# Patient Record
Sex: Female | Born: 1965
Health system: Southern US, Academic
[De-identification: ages and names within clinical notes are randomized; demographics above are authoritative.]

## PROBLEM LIST (undated history)

## (undated) ENCOUNTER — Encounter

## (undated) ENCOUNTER — Telehealth: Attending: Dermatology | Primary: Dermatology

## (undated) ENCOUNTER — Encounter: Attending: Infectious Disease | Primary: Infectious Disease

## (undated) ENCOUNTER — Telehealth: Attending: Obstetrics & Gynecology | Primary: Obstetrics & Gynecology

## (undated) ENCOUNTER — Encounter: Attending: Hematology & Oncology | Primary: Hematology & Oncology

## (undated) ENCOUNTER — Encounter: Attending: Family | Primary: Family

## (undated) ENCOUNTER — Telehealth

## (undated) ENCOUNTER — Ambulatory Visit: Payer: PRIVATE HEALTH INSURANCE

## (undated) ENCOUNTER — Ambulatory Visit

## (undated) ENCOUNTER — Encounter: Attending: Ambulatory Care | Primary: Ambulatory Care

## (undated) ENCOUNTER — Ambulatory Visit: Attending: Pharmacist | Primary: Pharmacist

## (undated) ENCOUNTER — Encounter: Attending: Pharmacist | Primary: Pharmacist

## (undated) ENCOUNTER — Telehealth: Attending: Ambulatory Care | Primary: Ambulatory Care

## (undated) ENCOUNTER — Encounter: Attending: Obstetrics & Gynecology | Primary: Obstetrics & Gynecology

## (undated) ENCOUNTER — Ambulatory Visit: Payer: PRIVATE HEALTH INSURANCE | Attending: Radiation Oncology | Primary: Radiation Oncology

## (undated) ENCOUNTER — Ambulatory Visit: Payer: PRIVATE HEALTH INSURANCE | Attending: Hematology & Oncology | Primary: Hematology & Oncology

## (undated) ENCOUNTER — Encounter: Attending: Clinical | Primary: Clinical

## (undated) ENCOUNTER — Ambulatory Visit: Payer: PRIVATE HEALTH INSURANCE | Attending: Dermatology | Primary: Dermatology

## (undated) ENCOUNTER — Telehealth: Attending: Hematology & Oncology | Primary: Hematology & Oncology

## (undated) ENCOUNTER — Ambulatory Visit
Attending: Student in an Organized Health Care Education/Training Program | Primary: Student in an Organized Health Care Education/Training Program

## (undated) ENCOUNTER — Ambulatory Visit: Payer: PRIVATE HEALTH INSURANCE | Attending: Nutritionist | Primary: Nutritionist

## (undated) ENCOUNTER — Telehealth: Attending: Family | Primary: Family

## (undated) ENCOUNTER — Ambulatory Visit: Payer: PRIVATE HEALTH INSURANCE | Attending: Family Medicine | Primary: Family Medicine

## (undated) DIAGNOSIS — D259 Leiomyoma of uterus, unspecified: Secondary | ICD-10-CM

## (undated) DIAGNOSIS — N879 Dysplasia of cervix uteri, unspecified: Secondary | ICD-10-CM

## (undated) DIAGNOSIS — E669 Obesity, unspecified: Secondary | ICD-10-CM

## (undated) DIAGNOSIS — M25569 Pain in unspecified knee: Secondary | ICD-10-CM

## (undated) DIAGNOSIS — B977 Papillomavirus as the cause of diseases classified elsewhere: Secondary | ICD-10-CM

## (undated) DIAGNOSIS — G43909 Migraine, unspecified, not intractable, without status migrainosus: Secondary | ICD-10-CM

## (undated) DIAGNOSIS — D509 Iron deficiency anemia, unspecified: Secondary | ICD-10-CM

## (undated) DIAGNOSIS — H209 Unspecified iridocyclitis: Secondary | ICD-10-CM

## (undated) DIAGNOSIS — C50919 Malignant neoplasm of unspecified site of unspecified female breast: Secondary | ICD-10-CM

## (undated) DIAGNOSIS — G43829 Menstrual migraine, not intractable, without status migrainosus: Secondary | ICD-10-CM

## (undated) HISTORY — DX: Menstrual migraine, not intractable, without status migrainosus: G43.829

## (undated) HISTORY — DX: Malignant neoplasm of unspecified site of unspecified female breast: C50.919

## (undated) HISTORY — DX: Migraine, unspecified, not intractable, without status migrainosus: G43.909

## (undated) HISTORY — PX: HALLUX VALGUS CORRECTION: SUR315

## (undated) HISTORY — DX: Unspecified iridocyclitis: H20.9

---
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
## 2007-06-08 ENCOUNTER — Ambulatory Visit: Payer: Self-pay | Admitting: Internal Medicine

## 2007-06-23 ENCOUNTER — Emergency Department: Payer: Self-pay

## 2007-06-23 ENCOUNTER — Other Ambulatory Visit: Payer: Self-pay

## 2007-07-22 ENCOUNTER — Ambulatory Visit: Payer: Self-pay | Admitting: Emergency Medicine

## 2007-08-06 ENCOUNTER — Ambulatory Visit: Payer: Self-pay | Admitting: *Deleted

## 2007-09-16 ENCOUNTER — Ambulatory Visit: Payer: Self-pay | Admitting: Internal Medicine

## 2010-08-28 ENCOUNTER — Emergency Department: Payer: Self-pay | Admitting: Emergency Medicine

## 2011-12-10 HISTORY — PX: FOOT SURGERY: SHX648

## 2013-02-07 ENCOUNTER — Ambulatory Visit: Payer: Self-pay | Admitting: Family Medicine

## 2013-02-24 ENCOUNTER — Emergency Department: Payer: Self-pay

## 2013-02-24 LAB — COMPREHENSIVE METABOLIC PANEL
Albumin: 3.9 g/dL (ref 3.4–5.0)
Alkaline Phosphatase: 58 U/L (ref 50–136)
Anion Gap: 7 (ref 7–16)
BUN: 14 mg/dL (ref 7–18)
Bilirubin,Total: 0.3 mg/dL (ref 0.2–1.0)
Chloride: 106 mmol/L (ref 98–107)
Co2: 25 mmol/L (ref 21–32)
Creatinine: 0.96 mg/dL (ref 0.60–1.30)
EGFR (African American): >60
EGFR (Non-African Amer.): >60
Glucose: 83 mg/dL (ref 65–99)
Osmolality: 275 (ref 275–301)
SGOT(AST): 17 U/L (ref 15–37)
Sodium: 138 mmol/L (ref 136–145)

## 2013-02-24 LAB — CBC
HCT: 29.5 % — ABNORMAL LOW (ref 35.0–47.0)
HGB: 9.3 g/dL — ABNORMAL LOW (ref 12.0–16.0)
MCH: 20.7 pg — ABNORMAL LOW (ref 26.0–34.0)
MCHC: 31.6 g/dL — ABNORMAL LOW (ref 32.0–36.0)
MCV: 66 fL — ABNORMAL LOW (ref 80–100)
RBC: 4.51 10*6/uL (ref 3.80–5.20)
RDW: 17.2 % — ABNORMAL HIGH (ref 11.5–14.5)

## 2013-02-24 LAB — CK TOTAL AND CKMB (NOT AT ARMC): CK, Total: 73 U/L (ref 21–215)

## 2013-03-26 DIAGNOSIS — D509 Iron deficiency anemia, unspecified: Secondary | ICD-10-CM | POA: Insufficient documentation

## 2013-04-25 ENCOUNTER — Ambulatory Visit: Payer: Self-pay | Admitting: Family Medicine

## 2013-04-25 LAB — RAPID STREP-A WITH REFLX: Micro Text Report: NEGATIVE

## 2013-04-29 LAB — BETA STREP CULTURE(ARMC)

## 2014-07-31 ENCOUNTER — Ambulatory Visit: Payer: Self-pay | Admitting: Physician Assistant

## 2015-01-05 DIAGNOSIS — E669 Obesity, unspecified: Secondary | ICD-10-CM | POA: Insufficient documentation

## 2016-04-20 ENCOUNTER — Ambulatory Visit
Admission: EM | Admit: 2016-04-20 | Discharge: 2016-04-20 | Disposition: A | Discharge status: Home / Self Care | Payer: Managed Care, Other (non HMO) | Attending: Family Medicine | Admitting: Family Medicine

## 2016-04-20 ENCOUNTER — Encounter: Payer: Self-pay | Admitting: Gynecology

## 2016-04-20 DIAGNOSIS — B3749 Other urogenital candidiasis: Secondary | ICD-10-CM

## 2016-04-20 DIAGNOSIS — M545 Low back pain, unspecified: Secondary | ICD-10-CM

## 2016-04-20 HISTORY — DX: Leiomyoma of uterus, unspecified: D25.9

## 2016-04-20 HISTORY — DX: Dysplasia of cervix uteri, unspecified: N87.9

## 2016-04-20 HISTORY — DX: Obesity, unspecified: E66.9

## 2016-04-20 HISTORY — DX: Papillomavirus as the cause of diseases classified elsewhere: B97.7

## 2016-04-20 HISTORY — DX: Iron deficiency anemia, unspecified: D50.9

## 2016-04-20 HISTORY — DX: Pain in unspecified knee: M25.569

## 2016-04-20 LAB — URINALYSIS COMPLETE WITH MICROSCOPIC (ARMC ONLY)
Bacteria, UA: NONE SEEN
Bilirubin Urine: NEGATIVE
Glucose, UA: NEGATIVE mg/dL
Hgb urine dipstick: NEGATIVE
KETONES UR: NEGATIVE mg/dL
Leukocytes, UA: NEGATIVE
Nitrite: NEGATIVE
PROTEIN: NEGATIVE mg/dL
Specific Gravity, Urine: 1.025 (ref 1.005–1.030)
pH: 5 (ref 5.0–8.0)

## 2016-04-20 MED ORDER — ORPHENADRINE CITRATE ER 100 MG PO TB12: 100.0000 mg | ORAL_TABLET | Freq: Two times a day (BID) | ORAL | Status: DC

## 2016-04-20 MED ORDER — FLUCONAZOLE 150 MG PO TABS: 150.0000 mg | ORAL_TABLET | Freq: Once | ORAL | Status: DC

## 2016-04-20 MED ORDER — MELOXICAM 15 MG PO TABS: 15.0000 mg | ORAL_TABLET | Freq: Every day | ORAL | Status: DC

## 2016-04-20 NOTE — ED Provider Notes (Signed)
CSN: [id number]     Arrival date & time 04/20/16  0813 History   First MD Initiated Contact with Patient 04/20/16 [phone]    Nurses notes were reviewed. Chief Complaint  Patient presents with  . Urinary Tract Infection  Patient reports having increased back pain. Was seen by her back Dr. on Tuesday of this week back was adjusted. She continued to have back pain and she went back to see her back doctor on Friday for more adjustment. She states the back pains on both sides and seems to move towards the front. She was worried that she may have had a UTI she's never had a UTI since she came in to be seen since she woke this morning still back discomfort. She did state that the adjustment Friday seemed to help from an hour to within the back started bothering her again     Denies having your vaginal discharge she's not had sexual relations for over a year. She has history of iron deficiency anemia fibroid tumor HPV and dysplasia of the cervix. She's never smoked no pertinent family medical history. No known drug allergies.   (Consider location/radiation/quality/duration/timing/severity/associated sxs/prior Treatment) Patient is a 50 y.o. female presenting with urinary tract infection. The history is provided by the patient. No language interpreter was used.  Urinary Tract Infection Pain quality:  Aching Duration:  5 days Timing:  Constant Progression:  Worsening Chronicity:  New Relieved by:  Nothing Ineffective treatments: Spinal manipulation. Urinary symptoms: no discolored urine, no foul-smelling urine, no frequent urination, no hematuria, no hesitancy and no bladder incontinence   Associated symptoms: abdominal pain   Associated symptoms: no fever, no genital lesions and no vaginal discharge   Risk factors: no hx of pyelonephritis, no kidney transplant, no recurrent urinary tract infections, no renal disease, not sexually active, not single kidney, no sexually transmitted infections and no  urinary catheter     Past Medical History  Diagnosis Date  . Obesity   . Knee pain     Left  . Iron deficiency anemia   . Leiomyoma of uterus   . HPV (human papilloma virus) infection   . Dysplasia of cervix    Past Surgical History  Procedure Laterality Date  . Hallux valgus correction    . Foot surgery Bilateral    No family history on file. Social History  Substance Use Topics  . Smoking status: Never Smoker   . Smokeless tobacco: None  . Alcohol Use: No   OB History    No data available     Review of Systems  Constitutional: Negative for fever.  Gastrointestinal: Positive for abdominal pain.  Genitourinary: Negative for vaginal discharge.  All other systems reviewed and are negative.   Allergies  Review of patient's allergies indicates no known allergies.  Home Medications   Prior to Admission medications   Medication Sig Start Date End Date Taking? Authorizing Provider  traMADol (ULTRAM) 50 MG tablet Take by mouth every 6 (six) hours as needed.   Yes Historical Provider, MD  typhoid (VIVOTIF) DR capsule Take 1 capsule by mouth every other day.   Yes Historical Provider, MD  fluconazole (DIFLUCAN) 150 MG tablet Take 1 tablet (150 mg total) by mouth once. 04/20/16   Frederich Cha, MD  meloxicam (MOBIC) 15 MG tablet Take 1 tablet (15 mg total) by mouth daily. 04/20/16   Frederich Cha, MD  orphenadrine (NORFLEX) 100 MG tablet Take 1 tablet (100 mg total) by mouth 2 (two) times daily. 04/20/16  Frederich Cha, MD   Meds Ordered and Administered this Visit  Medications - No data to display  BP 117/77 mmHg  Pulse 60  Temp(Src) 98 F (36.7 C) (Oral)  Resp 18  Ht 5\' 5"  (1.651 m)  Wt 243 lb (110.224 kg)  BMI 40.44 kg/m2  SpO2 100%  LMP 03/30/2016 No data found.   Physical Exam  Constitutional: She is oriented to person, place, and time. She appears well-developed and well-nourished.  HENT:  Head: Normocephalic and atraumatic.  Eyes: Pupils are equal, round, and  reactive to light.  Neck: Neck supple.  Abdominal: Soft. Bowel sounds are normal. She exhibits no distension. There is no tenderness.  Musculoskeletal: She exhibits tenderness.       Lumbar back: She exhibits spasm.       Back:  Patient with muscle spasm bilaterally over the lower back.  Neurological: She is alert and oriented to person, place, and time.  Skin: Skin is warm.  Psychiatric: She has a normal mood and affect.  Vitals reviewed.   ED Course  Procedures (including critical care time)  Labs Review Labs Reviewed  URINALYSIS COMPLETEWITH MICROSCOPIC ([place]) - Abnormal; Notable for the following:    Squamous Epithelial / LPF 6-30 (*)    All other components within normal limits  URINE CULTURE    Imaging Review No results found.   Visual Acuity Review  Right Eye Distance:   Left Eye Distance:   Bilateral Distance:    Right Eye Near:   Left Eye Near:    Bilateral Near:     Results for orders placed or performed during the hospital encounter of 04/20/16  Urinalysis complete, with microscopic  Result Value Ref Range   Color, Urine YELLOW YELLOW   APPearance CLEAR CLEAR   Glucose, UA NEGATIVE NEGATIVE mg/dL   Bilirubin Urine NEGATIVE NEGATIVE   Ketones, ur NEGATIVE NEGATIVE mg/dL   Specific Gravity, Urine 1.025 1.005 - 1.030   Hgb urine dipstick NEGATIVE NEGATIVE   pH 5.0 5.0 - 8.0   Protein, ur NEGATIVE NEGATIVE mg/dL   Nitrite NEGATIVE NEGATIVE   Leukocytes, UA NEGATIVE NEGATIVE   RBC / HPF 0-5 0 - 5 RBC/hpf   WBC, UA 0-5 0 - 5 WBC/hpf   Bacteria, UA NONE SEEN NONE SEEN   Squamous Epithelial / LPF 6-30 (A) NONE SEEN   Budding Yeast PRESENT       MDM   1. Bilateral low back pain without sciatica   2. Yeast UTI    Really think the patient's problem is muscle spasms. Will place patient on Norflex he was placed on tramadol earlier add Mobic as well. For the yeast will place on Diflucan. Follow-up PCP if not better in a week.  We'll treat with  antibiotic urine culture comes back positive for bacterial organism.   Note: This dictation was prepared with Dragon dictation along with smaller phrase technology. Any transcriptional errors that result from this process are unintentional.    Frederich Cha, MD 04/20/16 [phone]

## 2016-04-20 NOTE — Discharge Instructions (Signed)
Back Pain, Adult Back pain is very common. The pain often gets better over time. The cause of back pain is usually not dangerous. Most people can learn to manage their back pain on their own.  HOME CARE  Watch your back pain for any changes. The following actions may help to lessen any pain you are feeling:  Stay active. Start with short walks on flat ground if you can. Try to walk farther each day.  Exercise regularly as told by your doctor. Exercise helps your back heal faster. It also helps avoid future injury by keeping your muscles strong and flexible.  Do not sit, drive, or stand in one place for more than 30 minutes.  Do not stay in bed. Resting more than 1-2 days can slow down your recovery.  Be careful when you bend or lift an object. Use good form when lifting:  Bend at your knees.  Keep the object close to your body.  Do not twist.  Sleep on a firm mattress. Lie on your side, and bend your knees. If you lie on your back, put a pillow under your knees.  Take medicines only as told by your doctor.  Put ice on the injured area.  Put ice in a plastic bag.  Place a towel between your skin and the bag.  Leave the ice on for 20 minutes, 2-3 times a day for the first 2-3 days. After that, you can switch between ice and heat packs.  Avoid feeling anxious or stressed. Find good ways to deal with stress, such as exercise.  Maintain a healthy weight. Extra weight puts stress on your back. GET HELP IF:   You have pain that does not go away with rest or medicine.  You have worsening pain that goes down into your legs or buttocks.  You have pain that does not get better in one week.  You have pain at night.  You lose weight.  You have a fever or chills. GET HELP RIGHT AWAY IF:   You cannot control when you poop (bowel movement) or pee (urinate).  Your arms or legs feel weak.  Your arms or legs lose feeling (numbness).  You feel sick to your stomach (nauseous) or  throw up (vomit).  You have belly (abdominal) pain.  You feel like you may pass out (faint).   This information is not intended to replace advice given to you by your health care provider. Make sure you discuss any questions you have with your health care provider.   Document Released: 05/13/2008 Document Revised: 12/16/2014 Document Reviewed: 03/29/2014 Elsevier Interactive Patient Education 2016 Elsevier Inc[employer]  Musculoskeletal Pain Musculoskeletal pain is muscle and boney aches and pains. These pains can occur in any part of the body. Your caregiver may treat you without knowing the cause of the pain. They may treat you if blood or urine tests, X-rays, and other tests were normal.  CAUSES There is often not a definite cause or reason for these pains. These pains may be caused by a type of germ (virus). The discomfort may also come from overuse. Overuse includes working out too hard when your body is not fit. Boney aches also come from weather changes. Bone is sensitive to atmospheric pressure changes. HOME CARE INSTRUCTIONS   Ask when your test results will be ready. Make sure you get your test results.  Only take over-the-counter or prescription medicines for pain, discomfort, or fever as directed by your caregiver. If you were given medications  for your condition, do not drive, operate machinery or power tools, or sign legal documents for 24 hours. Do not drink alcohol. Do not take sleeping pills or other medications that may interfere with treatment.  Continue all activities unless the activities cause more pain. When the pain lessens, slowly resume normal activities. Gradually increase the intensity and duration of the activities or exercise.  During periods of severe pain, bed rest may be helpful. Lay or sit in any position that is comfortable.  Putting ice on the injured area.  Put ice in a bag.  Place a towel between your skin and the bag.  Leave the ice on for 15 to 20  minutes, 3 to 4 times a day.  Follow up with your caregiver for continued problems and no reason can be found for the pain. If the pain becomes worse or does not go away, it may be necessary to repeat tests or do additional testing. Your caregiver may need to look further for a possible cause. SEEK IMMEDIATE MEDICAL CARE IF:  You have pain that is getting worse and is not relieved by medications.  You develop chest pain that is associated with shortness or breath, sweating, feeling sick to your stomach (nauseous), or throw up (vomit).  Your pain becomes localized to the abdomen.  You develop any new symptoms that seem different or that concern you. MAKE SURE YOU:   Understand these instructions.  Will watch your condition.  Will get help right away if you are not doing well or get worse.   This information is not intended to replace advice given to you by your health care provider. Make sure you discuss any questions you have with your health care provider.   Document Released: 11/25/2005 Document Revised: 02/17/2012 Document Reviewed: 07/30/2013 Elsevier Interactive Patient Education 2016 Elsevier Inc. Monilial Vaginitis Vaginitis in a soreness, swelling and redness (inflammation) of the vagina and vulva. Monilial vaginitis is not a sexually transmitted infection. CAUSES  Yeast vaginitis is caused by yeast (candida) that is normally found in your vagina. With a yeast infection, the candida has overgrown in number to a point that upsets the chemical balance. SYMPTOMS  White, thick vaginal discharge. Swelling, itching, redness and irritation of the vagina and possibly the lips of the vagina (vulva). Burning or painful urination. Painful intercourse. DIAGNOSIS  Things that may contribute to monilial vaginitis are: Postmenopausal and virginal states. Pregnancy. Infections. Being tired, sick or stressed, especially if you had monilial vaginitis in the past. Diabetes. Good control  will help lower the chance. Birth control pills. Tight fitting garments. Using bubble bath, feminine sprays, douches or deodorant tampons. Taking certain medications that kill germs (antibiotics). Sporadic recurrence can occur if you become ill. TREATMENT  Your caregiver will give you medication. There are several kinds of anti monilial vaginal creams and suppositories specific for monilial vaginitis. For recurrent yeast infections, use a suppository or cream in the vagina 2 times a week, or as directed. Anti-monilial or steroid cream for the itching or irritation of the vulva may also be used. Get your caregiver's permission. Painting the vagina with methylene blue solution may help if the monilial cream does not work. Eating yogurt may help prevent monilial vaginitis. HOME CARE INSTRUCTIONS  Finish all medication as prescribed. Do not have sex until treatment is completed or after your caregiver tells you it is okay. Take warm sitz baths. Do not douche. Do not use tampons, especially scented ones. Wear cotton underwear. Avoid tight pants and  panty hose. Tell your sexual partner that you have a yeast infection. They should go to their caregiver if they have symptoms such as mild rash or itching. Your sexual partner should be treated as well if your infection is difficult to eliminate. Practice safer sex. Use condoms. Some vaginal medications cause latex condoms to fail. Vaginal medications that harm condoms are: Cleocin cream. Butoconazole (Femstat). Terconazole (Terazol) vaginal suppository. Miconazole (Monistat) (may be purchased over the counter). SEEK MEDICAL CARE IF:  You have a temperature by mouth above 102 F (38.9 C). The infection is getting worse after 2 days of treatment. The infection is not getting better after 3 days of treatment. You develop blisters in or around your vagina. You develop vaginal bleeding, and it is not your menstrual period. You have pain when  you urinate. You develop intestinal problems. You have pain with sexual intercourse.   This information is not intended to replace advice given to you by your health care provider. Make sure you discuss any questions you have with your health care provider.   Document Released: 09/04/2005 Document Revised: 02/17/2012 Document Reviewed: 05/29/2015 Elsevier Interactive Patient Education [employer].

## 2016-04-20 NOTE — ED Notes (Signed)
Patient c/o lower back and pelvic pain x 3 days.

## 2016-04-22 LAB — URINE CULTURE

## 2016-10-31 ENCOUNTER — Encounter: Payer: Self-pay | Admitting: Emergency Medicine

## 2016-10-31 ENCOUNTER — Emergency Department
Admission: EM | Admit: 2016-10-31 | Discharge: 2016-10-31 | Disposition: A | Discharge status: Home / Self Care | Payer: Managed Care, Other (non HMO) | Attending: Emergency Medicine | Admitting: Emergency Medicine

## 2016-10-31 DIAGNOSIS — J029 Acute pharyngitis, unspecified: Secondary | ICD-10-CM | POA: Diagnosis present

## 2016-10-31 DIAGNOSIS — Z79899 Other long term (current) drug therapy: Secondary | ICD-10-CM | POA: Diagnosis not present

## 2016-10-31 LAB — POCT RAPID STREP A: STREPTOCOCCUS, GROUP A SCREEN (DIRECT): NEGATIVE

## 2016-10-31 MED ORDER — AMOXICILLIN-POT CLAVULANATE 875-125 MG PO TABS
1.0000 | ORAL_TABLET | Freq: Once | ORAL | Status: AC
  Administered 2016-10-31: 1 via ORAL

## 2016-10-31 MED ORDER — AMOXICILLIN-POT CLAVULANATE 200-28.5 MG PO CHEW: 1.0000 | CHEWABLE_TABLET | Freq: Two times a day (BID) | ORAL | 0 refills | Status: AC

## 2016-10-31 MED ORDER — AMOXICILLIN-POT CLAVULANATE 875-125 MG PO TABS
ORAL_TABLET | ORAL | Status: AC
  Administered 2016-10-31: 1 via ORAL
  Filled 2016-10-31: qty 1

## 2016-10-31 NOTE — ED Provider Notes (Signed)
[facility] Emergency Department Provider Note    First MD Initiated Contact with Patient 10/31/16 [phone]     (approximate)  I have reviewed the triage vital signs and the nursing notes.   HISTORY  Chief Complaint Sore Throat    HPI Kristina Graham is a 50 y.o. female with low list of chronic medical conditions presents to the emergency department with sore throat 3 days. Patient denies any fever afebrile on presentation temperature 90.7. Patient denies any other complaints. Patient admits to previous history of strep throat in the past. Current pain score 8 out of 10   Past Medical History:  Diagnosis Date  . Dysplasia of cervix   . HPV (human papilloma virus) infection   . Iron deficiency anemia   . Knee pain    Left  . Leiomyoma of uterus   . Obesity     There are no active problems to display for this patient.   Past Surgical History:  Procedure Laterality Date  . FOOT SURGERY Bilateral   . HALLUX VALGUS CORRECTION      Prior to Admission medications   Medication Sig Start Date End Date Taking? Authorizing Provider  fluconazole (DIFLUCAN) 150 MG tablet Take 1 tablet (150 mg total) by mouth once. 04/20/16   Frederich Cha, MD  meloxicam (MOBIC) 15 MG tablet Take 1 tablet (15 mg total) by mouth daily. 04/20/16   Frederich Cha, MD  orphenadrine (NORFLEX) 100 MG tablet Take 1 tablet (100 mg total) by mouth 2 (two) times daily. 04/20/16   Frederich Cha, MD  traMADol Veatrice Bourbon) 50 MG tablet Take by mouth every 6 (six) hours as needed.    Historical Provider, MD  typhoid (VIVOTIF) DR capsule Take 1 capsule by mouth every other day.    Historical Provider, MD    Allergies No known drug allergies History reviewed. No pertinent family history.  Social History Social History  Substance Use Topics  . Smoking status: Never Smoker  . Smokeless tobacco: Never Used  . Alcohol use No    Review of Systems Constitutional: No fever/chills Eyes: No visual  changes. [id number] for sore throat. Cardiovascular: Denies chest pain. Respiratory: Denies shortness of breath. Gastrointestinal: No abdominal pain.  No nausea, no vomiting.  No diarrhea.  No constipation. Genitourinary: Negative for dysuria. Musculoskeletal: Negative for back pain. Skin: Negative for rash. Neurological: Negative for headaches, focal weakness or numbness.  10-point ROS otherwise negative.  ____________________________________________   PHYSICAL EXAM:  VITAL SIGNS: ED Triage Vitals  Enc Vitals Group     BP 10/31/16 0536 133/86     Pulse Rate 10/31/16 0536 90     Resp 10/31/16 0536 18     Temp 10/31/16 0536 98.7 F (37.1 C)     Temp Source 10/31/16 0536 Oral     SpO2 10/31/16 0536 99 %     Weight 10/31/16 0527 240 lb (108.9 kg)     Height 10/31/16 0527 5\' 5"  (1.651 m)     Head Circumference --      Peak Flow --      Pain Score 10/31/16 0527 7     Pain Loc --      Pain Edu? --      Excl. in [place]? --     Constitutional: Alert and oriented. Well appearing and in no acute distress. Eyes: Conjunctivae are normal. PERRL. EOMI. Head: Atraumatic. Ears:  Healthy appearing ear canals and TMs bilaterally Nose: No congestion/rhinnorhea. Mouth/Throat: Mucous membranes are moist.  Oropharynx non-erythematous. Neck: No stridor.  No meningeal signs.  No cervical spine tenderness to palpation. Cardiovascular: Normal rate, regular rhythm. Good peripheral circulation. Grossly normal heart sounds. Respiratory: Normal respiratory effort.  No retractions. Lungs CTAB. Gastrointestinal: Soft and nontender. No distention.  Musculoskeletal: No lower extremity tenderness nor edema. No gross deformities of extremities. Neurologic:  Normal speech and language. No gross focal neurologic deficits are appreciated.  Skin:  Skin is warm, dry and intact. No rash noted. Psychiatric: Mood and affect are normal. Speech and behavior are  normal.  ____________________________________________   LABS (all labs ordered are listed, but only abnormal results are displayed)  Labs Reviewed  CULTURE, GROUP A STREP [facility])  POCT RAPID STREP A      Procedures     INITIAL IMPRESSION / ASSESSMENT AND PLAN / ED COURSE  Pertinent labs & imaging results that were available during my care of the patient were reviewed by me and considered in my medical decision making (see chart for details).     Clinical Course     ____________________________________________  FINAL CLINICAL IMPRESSION(S) / ED DIAGNOSES  Final diagnoses:  Acute pharyngitis, unspecified etiology     MEDICATIONS GIVEN DURING THIS VISIT:  Medications  amoxicillin-clavulanate (AUGMENTIN) 875-125 MG per tablet 1 tablet (not administered)     NEW OUTPATIENT MEDICATIONS STARTED DURING THIS VISIT:  New Prescriptions   No medications on file    Modified Medications   No medications on file    Discontinued Medications   No medications on file     Note:  This document was prepared using Dragon voice recognition software and may include unintentional dictation errors.    Gregor Hams, MD 10/31/16 [phone]

## 2016-10-31 NOTE — ED Notes (Signed)
Discharge instructions reviewed with patient. Questions fielded by this RN. Patient verbalizes understanding of instructions. Patient discharged home in stable condition per Brown MD . No acute distress noted at time of discharge.   

## 2016-10-31 NOTE — ED Triage Notes (Signed)
Pt arrived to the ED for complaints of sore throat. Pt is AOx4 in no apparent distress.

## 2016-11-02 LAB — CULTURE, GROUP A STREP (THRC)

## 2017-01-28 ENCOUNTER — Ambulatory Visit: Payer: Self-pay | Admitting: Physician Assistant

## 2017-01-28 ENCOUNTER — Encounter: Payer: Self-pay | Admitting: Physician Assistant

## 2017-01-28 ENCOUNTER — Ambulatory Visit
Admission: RE | Admit: 2017-01-28 | Discharge: 2017-01-28 | Disposition: A | Discharge status: Home / Self Care | Payer: Managed Care, Other (non HMO) | Source: Ambulatory Visit | Attending: Physician Assistant | Admitting: Physician Assistant

## 2017-01-28 VITALS — BP 120/80 | HR 85 | Temp 98.4°F

## 2017-01-28 DIAGNOSIS — G459 Transient cerebral ischemic attack, unspecified: Secondary | ICD-10-CM | POA: Insufficient documentation

## 2017-01-28 NOTE — Progress Notes (Addendum)
S: states this weekend she got her hair washed then afterwards started having tingling sensations on her scalp and cold feeling on r side of her face, couldn't open her eye for several hours, was having to pry it open and finally it stayed open without difficulty, did have some slurred speech and blurred vision, states she is still having difficulty with words and feels like she is talking slowly, no rash on scalp, no hx htn, no fam hx of strokes Denies cp/sob  O: vitals wnl, nad, perrl eomi, able to open and close eyes without difficulty, lungs c t a, cv rrr, carotids without bruits, cn II-XII intact  A: tia?  P: ct head without contrast stat, if normal pt to start taking asa 81 mg qd  Discussed CT results with patient, will order [place] of carotids and refer to neuro

## 2017-01-29 NOTE — Addendum Note (Signed)
Addended by: Versie Starks on: 01/29/2017 02:40 PM   Modules accepted: Orders

## 2017-01-31 ENCOUNTER — Other Ambulatory Visit: Payer: Self-pay

## 2017-01-31 ENCOUNTER — Encounter (INDEPENDENT_AMBULATORY_CARE_PROVIDER_SITE_OTHER): Payer: Self-pay

## 2017-01-31 DIAGNOSIS — Z299 Encounter for prophylactic measures, unspecified: Secondary | ICD-10-CM

## 2017-01-31 NOTE — Progress Notes (Signed)
Patient came in to have blood drawn for testing per Manuela Schwartz Fisher's orders.

## 2017-02-01 LAB — CMP12+LP+TP+TSH+6AC+CBC/D/PLT
A/G RATIO: 1.3 (ref 1.2–2.2)
ALBUMIN: 4.2 g/dL (ref 3.5–5.5)
ALK PHOS: 52 IU/L (ref 39–117)
ALT: 8 IU/L (ref 0–32)
AST: 10 IU/L (ref 0–40)
BUN/Creatinine Ratio: 14 (ref 9–23)
BUN: 11 mg/dL (ref 6–24)
Basophils Absolute: 0 10*3/uL (ref 0.0–0.2)
Basos: 0 %
Bilirubin Total: 0.3 mg/dL (ref 0.0–1.2)
CALCIUM: 9.1 mg/dL (ref 8.7–10.2)
CHOLESTEROL TOTAL: 158 mg/dL (ref 100–199)
Chloride: 104 mmol/L (ref 96–106)
Chol/HDL Ratio: 2.8 ratio units (ref 0.0–4.4)
Creatinine, Ser: 0.77 mg/dL (ref 0.57–1.00)
EOS (ABSOLUTE): 0 10*3/uL (ref 0.0–0.4)
Eos: 1 %
Estimated CHD Risk: <0.5 times avg. (ref 0.0–1.0)
Free Thyroxine Index: 1.6 (ref 1.2–4.9)
GFR calc Af Amer: 105 mL/min/{1.73_m2} (ref >59)
GFR calc non Af Amer: 91 mL/min/{1.73_m2} (ref >59)
GGT: 31 IU/L (ref 0–60)
GLOBULIN, TOTAL: 3.2 g/dL (ref 1.5–4.5)
Glucose: 99 mg/dL (ref 65–99)
HDL: 56 mg/dL (ref >39)
HEMATOCRIT: 28.9 % — AB (ref 34.0–46.6)
Hemoglobin: 8.4 g/dL — ABNORMAL LOW (ref 11.1–15.9)
IMMATURE GRANS (ABS): 0 10*3/uL (ref 0.0–0.1)
Immature Granulocytes: 0 %
Iron: 24 ug/dL — ABNORMAL LOW (ref 27–159)
LDH: 139 IU/L (ref 119–226)
LDL CALC: 90 mg/dL (ref 0–99)
LYMPHS: 32 %
Lymphocytes Absolute: 1.5 10*3/uL (ref 0.7–3.1)
MCH: 19.3 pg — ABNORMAL LOW (ref 26.6–33.0)
MCHC: 29.1 g/dL — AB (ref 31.5–35.7)
MCV: 66 fL — ABNORMAL LOW (ref 79–97)
MONOS ABS: 0.3 10*3/uL (ref 0.1–0.9)
Monocytes: 7 %
Neutrophils Absolute: 2.7 10*3/uL (ref 1.4–7.0)
Neutrophils: 60 %
Phosphorus: 2.6 mg/dL (ref 2.5–4.5)
Platelets: 387 10*3/uL — ABNORMAL HIGH (ref 150–379)
Potassium: 4.5 mmol/L (ref 3.5–5.2)
RBC: 4.36 x10E6/uL (ref 3.77–5.28)
RDW: 18.4 % — ABNORMAL HIGH (ref 12.3–15.4)
Sodium: 141 mmol/L (ref 134–144)
T3 Uptake Ratio: 22 % — ABNORMAL LOW (ref 24–39)
T4 TOTAL: 7.4 ug/dL (ref 4.5–12.0)
TRIGLYCERIDES: 59 mg/dL (ref 0–149)
TSH: 1.72 u[IU]/mL (ref 0.450–4.500)
Total Protein: 7.4 g/dL (ref 6.0–8.5)
Uric Acid: 5 mg/dL (ref 2.5–7.1)
VLDL Cholesterol Cal: 12 mg/dL (ref 5–40)
WBC: 4.6 10*3/uL (ref 3.4–10.8)

## 2017-02-01 LAB — HEPATITIS C ANTIBODY (REFLEX): HCV Ab: <0.1 s/co ratio (ref 0.0–0.9)

## 2017-02-01 LAB — VITAMIN B12: VITAMIN B 12: 356 pg/mL (ref 232–1245)

## 2017-02-01 LAB — HCV COMMENT:

## 2017-02-01 LAB — HGB A1C W/O EAG: HEMOGLOBIN A1C: 5.5 % (ref 4.8–5.6)

## 2017-02-01 LAB — HIV ANTIBODY (ROUTINE TESTING W REFLEX): HIV Screen 4th Generation wRfx: NONREACTIVE

## 2017-02-04 ENCOUNTER — Ambulatory Visit
Admission: RE | Admit: 2017-02-04 | Discharge: 2017-02-04 | Disposition: A | Discharge status: Home / Self Care | Payer: Managed Care, Other (non HMO) | Source: Ambulatory Visit | Attending: Physician Assistant | Admitting: Physician Assistant

## 2017-02-04 DIAGNOSIS — G459 Transient cerebral ischemic attack, unspecified: Secondary | ICD-10-CM | POA: Insufficient documentation

## 2017-02-07 ENCOUNTER — Ambulatory Visit: Payer: Managed Care, Other (non HMO)

## 2017-03-04 ENCOUNTER — Ambulatory Visit (INDEPENDENT_AMBULATORY_CARE_PROVIDER_SITE_OTHER): Payer: Managed Care, Other (non HMO) | Admitting: Neurology

## 2017-03-04 ENCOUNTER — Encounter: Payer: Self-pay | Admitting: Neurology

## 2017-03-04 ENCOUNTER — Encounter (INDEPENDENT_AMBULATORY_CARE_PROVIDER_SITE_OTHER): Payer: Self-pay

## 2017-03-04 VITALS — BP 130/81 | HR 73 | Ht 65.0 in | Wt 247.5 lb

## 2017-03-04 DIAGNOSIS — D500 Iron deficiency anemia secondary to blood loss (chronic): Secondary | ICD-10-CM

## 2017-03-04 DIAGNOSIS — G43829 Menstrual migraine, not intractable, without status migrainosus: Secondary | ICD-10-CM | POA: Diagnosis not present

## 2017-03-04 DIAGNOSIS — H02401 Unspecified ptosis of right eyelid: Secondary | ICD-10-CM | POA: Diagnosis not present

## 2017-03-04 HISTORY — DX: Menstrual migraine, not intractable, without status migrainosus: G43.829

## 2017-03-04 MED ORDER — FERROUS GLUCONATE 324 (38 FE) MG PO TABS: 324.0000 mg | ORAL_TABLET | Freq: Every day | ORAL | 3 refills | Status: AC

## 2017-03-04 NOTE — Progress Notes (Signed)
Reason for visit: Headache, right-sided ptosis  Referring physician: Dr. Lanice Schwab is a 51 y.o. female  History of present illness:  [name] is a 51 year old right-handed black female with a history of menstrual migraine. The patient will have 3 or 4 headaches a month, usually on the right side of the head. Most of her headaches are just prior to onset of her menstrual cycle, but she will occasionally get headaches at nighttime while sleeping. The patient had onset of a headache around 01/25/2017 that started at nighttime. She got up to go the bathroom, she noted that she was unable to open the left eye. The eyelid completely closed, and she cannot see out of the right eye. She had a severe headache on the right with sharp pain from the right frontotemporal area going down to behind the right ear. She would try to hold the eyelid open, but it would droop back down. After an hour so, the problem with the eye resolved. She denied any numbness of the extremities or on the face. She did have some blurring of vision. She denied any weakness of the extremities or change in balance. The patient claims that the sharp shooting pains are unusual for her typical migraine. The patient went to her primary doctor 2 days later, and a CT scan of the brain was done and was unremarkable. The patient has not had recurrence of the above events since that time. She has been noted to have an iron deficiency anemia by blood work. With her typical migraine, she has right-sided pain, she may have nausea and vomiting, she may have photophobia and phonophobia, and some cognitive slowing. She is sent to this office for further evaluation. In the past, she could not tolerate Imitrex or Maxalt as it caused her throat to swell.  Past Medical History:  Diagnosis Date  . Dysplasia of cervix   . HPV (human papilloma virus) infection   . Iron deficiency anemia   . Knee pain    Left  . Leiomyoma of uterus   .  Menstrual migraine 03/04/2017  . Migraine   . Obesity     Past Surgical History:  Procedure Laterality Date  . FOOT SURGERY Bilateral 2013  . HALLUX VALGUS CORRECTION      Family History  Problem Relation Age of Onset  . Diabetes Mother   . Heart disease Mother   . Hypertension Mother   . COPD Mother   . Cancer Mother   . Diabetes Sister   . Diabetes Maternal Grandmother   . Heart disease Maternal Grandmother   . Hypertension Maternal Grandmother   . Cancer Maternal Grandmother     Social history:  reports that she has never smoked. She has never used smokeless tobacco. She reports that she does not drink alcohol or use drugs.  Medications:  Prior to Admission medications   Medication Sig Start Date End Date Taking? Authorizing Provider  ferrous gluconate (FERGON) 324 MG tablet Take 1 tablet (324 mg total) by mouth daily with breakfast. 03/04/17   Kathrynn Ducking, MD      Allergies  Allergen Reactions  . Sumatriptan Succinate     Other reaction(s): SWELLING/EDEMA    ROS:  Out of a complete 14 system review of symptoms, the patient complains only of the following symptoms, and all other reviewed systems are negative.  Weight gain, fatigue Blurred vision Snoring Muscle cramps  Blood pressure 130/81, pulse 73, height 5\' 5"  (1.651  m), weight 247 lb 8 oz (112.3 kg).  Physical Exam  General: The patient is alert and cooperative at the time of the examination. The patient is moderately to markedly obese.  Eyes: Pupils are equal, round, and reactive to light. Discs are flat bilaterally.  Neck: The neck is supple, no carotid bruits are noted.  Respiratory: The respiratory examination is clear.  Cardiovascular: The cardiovascular examination reveals a regular rate and rhythm, no obvious murmurs or rubs are noted.  Skin: Extremities are without significant edema.  Neurologic Exam  Mental status: The patient is alert and oriented x 3 at the time of the  examination. The patient has apparent normal recent and remote memory, with an apparently normal attention span and concentration ability.  Cranial nerves: Facial symmetry is present. There is good sensation of the face to pinprick and soft touch bilaterally. The strength of the facial muscles and the muscles to head turning and shoulder shrug are normal bilaterally. Speech is well enunciated, no aphasia or dysarthria is noted. Extraocular movements are full. Visual fields are full. The tongue is midline, and the patient has symmetric elevation of the soft palate. No obvious hearing deficits are noted.  Motor: The motor testing reveals 5 over 5 strength of all 4 extremities. Good symmetric motor tone is noted throughout.  Sensory: Sensory testing is intact to pinprick, soft touch, vibration sensation, and position sense on all 4 extremities. No evidence of extinction is noted.  Coordination: Cerebellar testing reveals good finger-nose-finger and heel-to-shin bilaterally.  Gait and station: Gait is normal. Tandem gait is normal. Romberg is negative. No drift is seen.  Reflexes: Deep tendon reflexes are symmetric and normal bilaterally. Toes are downgoing bilaterally.   Assessment/Plan:  1. History of menstrual migraine  2. Transient episode of ptosis, right eye  3. Iron deficiency anemia  The episode of sharp shooting pains with right-sided ptosis is unusual for this patient's headache. Migraine may cause a Horner's syndrome, but this generally does not close down the eye completely. For this reason, the patient be set up for further evaluation with MRI of the brain and MRA of the head to exclude an aneurysm. The clinical examination today is completely normal. The patient will follow-up if needed if the above study is unremarkable. The patient was given iron supplementation for her anemia.  Jill Alexanders MD 03/04/2017 11:52 AM  [facility] [address] [place], [place] [address]  Phone [phone] Fax [phone]

## 2017-03-04 NOTE — Patient Instructions (Signed)
   We will get MRI of the brain and MRA of the head. Will start iron therapy for anemia.

## 2017-03-14 DIAGNOSIS — Z8673 Personal history of transient ischemic attack (TIA), and cerebral infarction without residual deficits: Secondary | ICD-10-CM | POA: Insufficient documentation

## 2017-03-14 DIAGNOSIS — C50311 Malignant neoplasm of lower-inner quadrant of right female breast: Secondary | ICD-10-CM | POA: Insufficient documentation

## 2017-03-14 DIAGNOSIS — R87610 Atypical squamous cells of undetermined significance on cytologic smear of cervix (ASC-US): Secondary | ICD-10-CM | POA: Insufficient documentation

## 2017-03-14 DIAGNOSIS — Z171 Estrogen receptor negative status [ER-]: Secondary | ICD-10-CM

## 2017-03-14 DIAGNOSIS — R7303 Prediabetes: Secondary | ICD-10-CM | POA: Insufficient documentation

## 2017-03-21 ENCOUNTER — Inpatient Hospital Stay: Admission: RE | Admit: 2017-03-21 | Payer: Self-pay | Source: Ambulatory Visit

## 2017-03-21 ENCOUNTER — Other Ambulatory Visit: Payer: Self-pay

## 2017-04-14 DIAGNOSIS — R110 Nausea: Secondary | ICD-10-CM | POA: Insufficient documentation

## 2017-06-12 ENCOUNTER — Ambulatory Visit
Admission: RE | Admit: 2017-06-12 | Discharge: 2017-06-12 | Disposition: A | Discharge status: Home / Self Care | Payer: PRIVATE HEALTH INSURANCE | Attending: Family | Admitting: Family

## 2017-06-12 ENCOUNTER — Ambulatory Visit
Admission: RE | Admit: 2017-06-12 | Discharge: 2017-06-12 | Disposition: A | Discharge status: Home / Self Care | Payer: PRIVATE HEALTH INSURANCE

## 2017-06-12 DIAGNOSIS — T80212A Local infection due to central venous catheter, initial encounter: Secondary | ICD-10-CM

## 2017-06-12 DIAGNOSIS — C50311 Malignant neoplasm of lower-inner quadrant of right female breast: Principal | ICD-10-CM

## 2017-06-12 DIAGNOSIS — Z171 Estrogen receptor negative status [ER-]: Secondary | ICD-10-CM

## 2017-06-12 DIAGNOSIS — L0390 Cellulitis, unspecified: Secondary | ICD-10-CM

## 2017-06-12 MED ORDER — CEPHALEXIN 500 MG CAPSULE
ORAL_CAPSULE | Freq: Two times a day (BID) | ORAL | 0 refills | 0 days | Status: CP
Start: 2017-06-12 — End: 2017-06-19

## 2017-06-12 MED ORDER — LORAZEPAM 1 MG TABLET
ORAL_TABLET | Freq: Four times a day (QID) | ORAL | 0 refills | 0 days | Status: CP | PRN
Start: 2017-06-12 — End: 2018-09-10

## 2017-06-17 ENCOUNTER — Ambulatory Visit
Admission: RE | Admit: 2017-06-17 | Discharge: 2017-06-17 | Disposition: A | Discharge status: Home / Self Care | Payer: PRIVATE HEALTH INSURANCE

## 2017-06-17 DIAGNOSIS — Z452 Encounter for adjustment and management of vascular access device: Principal | ICD-10-CM

## 2017-06-25 ENCOUNTER — Ambulatory Visit
Admission: RE | Admit: 2017-06-25 | Discharge: 2017-06-25 | Disposition: A | Discharge status: Home / Self Care | Payer: PRIVATE HEALTH INSURANCE

## 2017-06-25 DIAGNOSIS — T82594A Other mechanical complication of infusion catheter, initial encounter: Principal | ICD-10-CM

## 2017-06-27 ENCOUNTER — Ambulatory Visit: Admission: RE | Admit: 2017-06-27 | Discharge: 2017-06-27 | Payer: PRIVATE HEALTH INSURANCE

## 2017-06-27 MED ORDER — HEPARIN, PORCINE (PF) 100 UNIT/ML INTRAVENOUS SYRINGE
INJECTION | INTRAVENOUS | 0 refills | 0.00000 days | Status: CP
Start: 2017-06-27 — End: 2018-09-10

## 2017-06-27 MED ORDER — SODIUM CHLORIDE 0.9 % (FLUSH) INJECTION SYRINGE
INTRAVENOUS | 1 refills | 0 days | Status: CP
Start: 2017-06-27 — End: 2018-09-10

## 2017-06-27 MED ORDER — HEPARIN, PORCINE (PF) 100 UNIT/ML INTRAVENOUS SYRINGE: 2 mL | Syringe | 0 refills | 0 days | Status: AC

## 2017-06-27 MED FILL — SODIUM CHLORIDE FLUSH(10ML)/0.9%/SOLN: SODIUM CHLORIDE FLUSH(10ML)/0.9%/SOLN | 7 days supply | Qty: 30 | Fill #0

## 2017-06-27 MED FILL — HEPARIN LOCK FLUSH/100/ML/SOLN: HEPARIN LOCK FLUSH/100/ML/SOLN | 28 days supply | Qty: 12 | Fill #0

## 2017-07-02 ENCOUNTER — Ambulatory Visit
Admission: RE | Admit: 2017-07-02 | Discharge: 2017-07-02 | Disposition: A | Discharge status: Home / Self Care | Payer: PRIVATE HEALTH INSURANCE

## 2017-07-02 ENCOUNTER — Ambulatory Visit
Admission: RE | Admit: 2017-07-02 | Discharge: 2017-07-02 | Disposition: A | Discharge status: Home / Self Care | Payer: MEDICAID | Attending: Hematology & Oncology | Admitting: Hematology & Oncology

## 2017-07-02 DIAGNOSIS — Z171 Estrogen receptor negative status [ER-]: Secondary | ICD-10-CM

## 2017-07-02 DIAGNOSIS — C50311 Malignant neoplasm of lower-inner quadrant of right female breast: Principal | ICD-10-CM

## 2017-07-09 ENCOUNTER — Ambulatory Visit
Admission: RE | Admit: 2017-07-09 | Discharge: 2017-08-08 | Disposition: A | Discharge status: Home / Self Care | Payer: MEDICAID

## 2017-07-09 ENCOUNTER — Ambulatory Visit
Admission: RE | Admit: 2017-07-09 | Discharge: 2017-08-08 | Disposition: A | Discharge status: Home / Self Care | Payer: PRIVATE HEALTH INSURANCE | Attending: Radiation Oncology | Admitting: Radiation Oncology

## 2017-07-09 DIAGNOSIS — Z171 Estrogen receptor negative status [ER-]: Secondary | ICD-10-CM

## 2017-07-09 DIAGNOSIS — Z510 Encounter for antineoplastic radiation therapy: Principal | ICD-10-CM

## 2017-07-09 DIAGNOSIS — C50311 Malignant neoplasm of lower-inner quadrant of right female breast: Secondary | ICD-10-CM

## 2017-07-19 ENCOUNTER — Ambulatory Visit
Admission: EM | Admit: 2017-07-19 | Discharge: 2017-07-19 | Disposition: A | Discharge status: Home / Self Care | Payer: Managed Care, Other (non HMO) | Attending: Family | Admitting: Family

## 2017-07-19 DIAGNOSIS — Z79899 Other long term (current) drug therapy: Secondary | ICD-10-CM | POA: Diagnosis not present

## 2017-07-19 DIAGNOSIS — L02411 Cutaneous abscess of right axilla: Secondary | ICD-10-CM

## 2017-07-19 DIAGNOSIS — L02412 Cutaneous abscess of left axilla: Secondary | ICD-10-CM

## 2017-07-19 DIAGNOSIS — Z9011 Acquired absence of right breast and nipple: Secondary | ICD-10-CM | POA: Diagnosis not present

## 2017-07-19 DIAGNOSIS — L02419 Cutaneous abscess of limb, unspecified: Secondary | ICD-10-CM | POA: Diagnosis not present

## 2017-07-19 MED ORDER — DOXYCYCLINE HYCLATE 100 MG PO TABS: 100.0000 mg | ORAL_TABLET | Freq: Two times a day (BID) | ORAL | 0 refills | Status: DC

## 2017-07-19 MED ORDER — MUPIROCIN 2 % EX OINT: 1.0000 "application " | TOPICAL_OINTMENT | Freq: Two times a day (BID) | CUTANEOUS | 0 refills | Status: DC

## 2017-07-19 NOTE — ED Notes (Signed)
Pt here for abcess under her arms after her chemo treatment. Wound was collected.

## 2017-07-19 NOTE — Discharge Instructions (Signed)
Antibiotics as discussed  Stay vigilant for signs of worsening infection, including fever, purulent drainage, pain.  Compresses  Ensure to take probiotics while on antibiotics and also for 2 weeks after completion. It is important to re-colonize the gut with good bacteria and also to prevent any diarrheal infections associated with antibiotic use.   If there is no improvement in your symptoms, or if there is any worsening of symptoms, or if you have any additional concerns, please return for re-evaluation; or, if we are closed, consider going to the Emergency Room for evaluation if symptoms urgent.

## 2017-07-19 NOTE — ED Provider Notes (Signed)
[facility]    CSN: [id number] Arrival date & time: 07/19/17  0946     History   Chief Complaint Chief Complaint  Patient presents with  . Abscess    HPI Kristina Graham is a 51 y.o. female.   CC: absess x 6 days, noticed tender, small 'knots' under right arm, improving slightly. Also notes another knot 'popped up under left arm and now right thigh.'   Warm compresses with some drainage under left arm.   First abscess started under left axilla, started on keflex 6 weeks ago, for 14 days, some relief however continue to drain.   No h/o MRSA Currently on chemotherapy for right breast cancer. S/p right lumpectomy 03/2017  Since chemo, has had more abscesses  No No fever, N, V.        Past Medical History:  Diagnosis Date  . Dysplasia of cervix   . HPV (human papilloma virus) infection   . Iron deficiency anemia   . Knee pain    Left  . Leiomyoma of uterus   . Menstrual migraine 03/04/2017  . Migraine   . Obesity     Patient Active Problem List   Diagnosis Date Noted  . Menstrual migraine 03/04/2017  . Iron deficiency anemia 03/04/2017    Past Surgical History:  Procedure Laterality Date  . FOOT SURGERY Bilateral 2013  . HALLUX VALGUS CORRECTION      OB History    No data available       Home Medications    Prior to Admission medications   Medication Sig Start Date End Date Taking? Authorizing Provider  doxycycline (VIBRA-TABS) 100 MG tablet Take 1 tablet (100 mg total) by mouth 2 (two) times daily. 07/19/17   Burnard Hawthorne, FNP  ferrous gluconate (FERGON) 324 MG tablet Take 1 tablet (324 mg total) by mouth daily with breakfast. 03/04/17   Kathrynn Ducking, MD  mupirocin ointment (BACTROBAN) 2 % Place 1 application into the nose 2 (two) times daily. 07/19/17   Burnard Hawthorne, FNP    Family History Family History  Problem Relation Age of Onset  . Diabetes Mother   . Heart disease Mother   . Hypertension Mother   . COPD  Mother   . Cancer Mother   . Diabetes Sister   . Diabetes Maternal Grandmother   . Heart disease Maternal Grandmother   . Hypertension Maternal Grandmother   . Cancer Maternal Grandmother     Social History Social History  Substance Use Topics  . Smoking status: Never Smoker  . Smokeless tobacco: Never Used  . Alcohol use No     Allergies   Sumatriptan succinate   Review of Systems Review of Systems  Constitutional: Negative for chills and fever.  Respiratory: Negative for cough.   Cardiovascular: Negative for chest pain and palpitations.  Gastrointestinal: Negative for nausea and vomiting.  Skin: Positive for wound. Negative for rash.     Physical Exam Triage Vital Signs ED Triage Vitals  Enc Vitals Group     BP      Pulse      Resp      Temp      Temp src      SpO2      Weight      Height      Head Circumference      Peak Flow      Pain Score      Pain Loc  Pain Edu?      Excl. in [place]?    No data found.   Updated Vital Signs BP 117/82 (BP Location: Left Arm)   Pulse 91   Temp 99.1 F (37.3 C) (Oral)   Resp 18   LMP 04/09/2017 Comment: pt on chemo  SpO2 100%   Visual Acuity Right Eye Distance:   Left Eye Distance:   Bilateral Distance:    Right Eye Near:   Left Eye Near:    Bilateral Near:     Physical Exam  Constitutional: She appears well-developed and well-nourished.  Eyes: Conjunctivae are normal.  Cardiovascular: Normal rate, regular rhythm, normal heart sounds and normal pulses.   Pulmonary/Chest: Effort normal and breath sounds normal. She has no wheezes. She has no rhonchi. She has no rales.  Neurological: She is alert.  Skin: Skin is warm and dry. Rash noted. Rash is nodular.  Multiple non fluctuant tender nodules bilateral axilla. One spontaneously draining under left arm, able to collect wound culture. No streaking, erythema, increased warmth.     Psychiatric: She has a normal mood and affect. Her speech is normal and  behavior is normal. Thought content normal.  Vitals reviewed.    UC Treatments / Results  Labs (all labs ordered are listed, but only abnormal results are displayed) Labs Reviewed  AEROBIC CULTURE (SUPERFICIAL SPECIMEN)    EKG  EKG Interpretation None       Radiology No results found.  Procedures Procedures (including critical care time)  Medications Ordered in UC Medications - No data to display   Initial Impression / Assessment and Plan / UC Course  I have reviewed the triage vital signs and the nursing notes.  Pertinent labs & imaging results that were available during my care of the patient were reviewed by me and considered in my medical decision making (see chart for details).      Final Clinical Impressions(s) / UC Diagnoses   Final diagnoses:  Abscess of axilla  Afebrile. Patient is well-appearing. Recently she been treated Keflex without complete resolution. Pending wound culture today to evaluate for MRSA. We'll cover empirically with MRSA agent, doxycycline and also topical Bactroban. Advised Dial soap. Warm compresses. Return precautions given.  Note GFR  >60, Crt 1.12 New Prescriptions Discharge Medication List as of 07/19/2017 11:19 AM    START taking these medications   Details  doxycycline (VIBRA-TABS) 100 MG tablet Take 1 tablet (100 mg total) by mouth 2 (two) times daily., Starting Sat 07/19/2017, Normal    mupirocin ointment (BACTROBAN) 2 % Place 1 application into the nose 2 (two) times daily., Starting Sat 07/19/2017, Normal         Controlled Substance Prescriptions [place] Controlled Substance Registry consulted? Not Applicable   Burnard Hawthorne, FNP 07/19/17 1146

## 2017-07-23 ENCOUNTER — Ambulatory Visit
Admission: RE | Admit: 2017-07-23 | Discharge: 2017-07-23 | Disposition: A | Discharge status: Home / Self Care | Payer: PRIVATE HEALTH INSURANCE

## 2017-07-23 ENCOUNTER — Ambulatory Visit
Admission: RE | Admit: 2017-07-23 | Discharge: 2017-07-23 | Disposition: A | Discharge status: Home / Self Care | Payer: PRIVATE HEALTH INSURANCE | Attending: Family | Admitting: Family

## 2017-07-23 DIAGNOSIS — D649 Anemia, unspecified: Secondary | ICD-10-CM | POA: Insufficient documentation

## 2017-07-23 DIAGNOSIS — Z171 Estrogen receptor negative status [ER-]: Secondary | ICD-10-CM

## 2017-07-23 DIAGNOSIS — C50311 Malignant neoplasm of lower-inner quadrant of right female breast: Principal | ICD-10-CM

## 2017-07-23 LAB — AEROBIC CULTURE  (SUPERFICIAL SPECIMEN)

## 2017-07-23 LAB — AEROBIC CULTURE W GRAM STAIN (SUPERFICIAL SPECIMEN)

## 2017-07-30 DIAGNOSIS — C50311 Malignant neoplasm of lower-inner quadrant of right female breast: Principal | ICD-10-CM

## 2017-07-30 DIAGNOSIS — Z171 Estrogen receptor negative status [ER-]: Secondary | ICD-10-CM

## 2017-08-04 ENCOUNTER — Ambulatory Visit
Admission: RE | Admit: 2017-08-04 | Discharge: 2017-08-04 | Disposition: A | Discharge status: Home / Self Care | Payer: PRIVATE HEALTH INSURANCE

## 2017-08-04 DIAGNOSIS — C50311 Malignant neoplasm of lower-inner quadrant of right female breast: Principal | ICD-10-CM

## 2017-08-12 ENCOUNTER — Ambulatory Visit
Admission: RE | Admit: 2017-08-12 | Discharge: 2017-09-07 | Disposition: A | Discharge status: Home / Self Care | Payer: BLUE CROSS/BLUE SHIELD | Attending: Radiation Oncology | Admitting: Radiation Oncology

## 2017-08-12 ENCOUNTER — Ambulatory Visit
Admission: RE | Admit: 2017-08-12 | Discharge: 2017-09-07 | Disposition: A | Discharge status: Home / Self Care | Payer: MEDICAID | Attending: Radiation Oncology | Admitting: Radiation Oncology

## 2017-08-12 ENCOUNTER — Ambulatory Visit
Admission: RE | Admit: 2017-08-12 | Discharge: 2017-09-07 | Disposition: A | Discharge status: Home / Self Care | Payer: PRIVATE HEALTH INSURANCE

## 2017-08-12 ENCOUNTER — Ambulatory Visit
Admission: RE | Admit: 2017-08-12 | Discharge: 2017-09-07 | Disposition: A | Discharge status: Home / Self Care | Payer: PRIVATE HEALTH INSURANCE | Attending: Radiation Oncology | Admitting: Radiation Oncology

## 2017-08-12 DIAGNOSIS — C50311 Malignant neoplasm of lower-inner quadrant of right female breast: Secondary | ICD-10-CM

## 2017-08-12 DIAGNOSIS — Z171 Estrogen receptor negative status [ER-]: Secondary | ICD-10-CM

## 2017-08-12 DIAGNOSIS — Z510 Encounter for antineoplastic radiation therapy: Principal | ICD-10-CM

## 2017-08-13 ENCOUNTER — Ambulatory Visit
Admission: RE | Admit: 2017-08-13 | Discharge: 2017-08-13 | Disposition: A | Discharge status: Home / Self Care | Payer: PRIVATE HEALTH INSURANCE

## 2017-08-13 ENCOUNTER — Ambulatory Visit
Admission: RE | Admit: 2017-08-13 | Discharge: 2017-08-13 | Disposition: A | Discharge status: Home / Self Care | Payer: PRIVATE HEALTH INSURANCE | Attending: Family | Admitting: Family

## 2017-08-13 ENCOUNTER — Ambulatory Visit
Admission: RE | Admit: 2017-08-13 | Discharge: 2017-08-13 | Disposition: A | Discharge status: Home / Self Care | Payer: MEDICAID

## 2017-08-13 DIAGNOSIS — Z171 Estrogen receptor negative status [ER-]: Secondary | ICD-10-CM

## 2017-08-13 DIAGNOSIS — C50311 Malignant neoplasm of lower-inner quadrant of right female breast: Principal | ICD-10-CM

## 2017-08-13 DIAGNOSIS — R51 Headache: Secondary | ICD-10-CM

## 2017-08-13 DIAGNOSIS — R3911 Hesitancy of micturition: Secondary | ICD-10-CM

## 2017-08-15 MED ORDER — DOXYCYCLINE HYCLATE 100 MG TABLET
ORAL_TABLET | Freq: Two times a day (BID) | ORAL | 2 refills | 0.00000 days | Status: CP
Start: 2017-08-15 — End: 2017-08-22

## 2017-08-20 DIAGNOSIS — Z171 Estrogen receptor negative status [ER-]: Secondary | ICD-10-CM

## 2017-08-20 DIAGNOSIS — C50311 Malignant neoplasm of lower-inner quadrant of right female breast: Principal | ICD-10-CM

## 2017-08-23 ENCOUNTER — Ambulatory Visit
Admission: RE | Admit: 2017-08-23 | Discharge: 2017-08-23 | Disposition: A | Discharge status: Home / Self Care | Payer: MEDICAID

## 2017-08-23 DIAGNOSIS — C50311 Malignant neoplasm of lower-inner quadrant of right female breast: Principal | ICD-10-CM

## 2017-08-23 DIAGNOSIS — Z171 Estrogen receptor negative status [ER-]: Secondary | ICD-10-CM

## 2017-08-23 DIAGNOSIS — R51 Headache: Secondary | ICD-10-CM

## 2017-08-23 DIAGNOSIS — R3911 Hesitancy of micturition: Secondary | ICD-10-CM

## 2017-08-27 DIAGNOSIS — C50311 Malignant neoplasm of lower-inner quadrant of right female breast: Principal | ICD-10-CM

## 2017-08-27 DIAGNOSIS — Z171 Estrogen receptor negative status [ER-]: Secondary | ICD-10-CM

## 2017-08-27 MED ORDER — DOXYCYCLINE HYCLATE 100 MG TABLET,DELAYED RELEASE
ORAL_TABLET | Freq: Two times a day (BID) | ORAL | 0 refills | 0 days | Status: CP
Start: 2017-08-27 — End: 2017-09-26

## 2017-09-03 ENCOUNTER — Ambulatory Visit
Admission: RE | Admit: 2017-09-03 | Discharge: 2017-09-03 | Disposition: A | Discharge status: Home / Self Care | Payer: PRIVATE HEALTH INSURANCE

## 2017-09-03 DIAGNOSIS — C50311 Malignant neoplasm of lower-inner quadrant of right female breast: Principal | ICD-10-CM

## 2017-09-03 DIAGNOSIS — Z171 Estrogen receptor negative status [ER-]: Secondary | ICD-10-CM

## 2017-09-08 ENCOUNTER — Ambulatory Visit
Admission: RE | Admit: 2017-09-08 | Discharge: 2017-10-08 | Disposition: A | Discharge status: Home / Self Care | Payer: PRIVATE HEALTH INSURANCE | Attending: Radiation Oncology | Admitting: Radiation Oncology

## 2017-09-08 ENCOUNTER — Ambulatory Visit
Admission: RE | Admit: 2017-09-08 | Discharge: 2017-10-08 | Disposition: A | Discharge status: Home / Self Care | Payer: MEDICAID | Attending: Radiation Oncology | Admitting: Radiation Oncology

## 2017-09-08 ENCOUNTER — Ambulatory Visit
Admission: RE | Admit: 2017-09-08 | Discharge: 2017-10-08 | Disposition: A | Discharge status: Home / Self Care | Payer: PRIVATE HEALTH INSURANCE

## 2017-09-08 ENCOUNTER — Ambulatory Visit
Admission: RE | Admit: 2017-09-08 | Discharge: 2017-10-08 | Disposition: A | Discharge status: Home / Self Care | Payer: BLUE CROSS/BLUE SHIELD | Attending: Radiation Oncology | Admitting: Radiation Oncology

## 2017-09-08 DIAGNOSIS — Z171 Estrogen receptor negative status [ER-]: Secondary | ICD-10-CM

## 2017-09-08 DIAGNOSIS — Z510 Encounter for antineoplastic radiation therapy: Principal | ICD-10-CM

## 2017-09-08 DIAGNOSIS — C50311 Malignant neoplasm of lower-inner quadrant of right female breast: Secondary | ICD-10-CM

## 2017-09-10 DIAGNOSIS — C50311 Malignant neoplasm of lower-inner quadrant of right female breast: Principal | ICD-10-CM

## 2017-09-10 DIAGNOSIS — Z171 Estrogen receptor negative status [ER-]: Secondary | ICD-10-CM

## 2017-09-17 DIAGNOSIS — C50311 Malignant neoplasm of lower-inner quadrant of right female breast: Principal | ICD-10-CM

## 2017-09-17 DIAGNOSIS — Z171 Estrogen receptor negative status [ER-]: Secondary | ICD-10-CM

## 2017-09-22 DIAGNOSIS — Z171 Estrogen receptor negative status [ER-]: Secondary | ICD-10-CM

## 2017-09-22 DIAGNOSIS — C50311 Malignant neoplasm of lower-inner quadrant of right female breast: Principal | ICD-10-CM

## 2017-09-24 ENCOUNTER — Ambulatory Visit
Admission: RE | Admit: 2017-09-24 | Discharge: 2017-09-24 | Disposition: A | Discharge status: Home / Self Care | Payer: PRIVATE HEALTH INSURANCE | Attending: Hematology & Oncology | Admitting: Hematology & Oncology

## 2017-09-24 ENCOUNTER — Ambulatory Visit
Admission: RE | Admit: 2017-09-24 | Discharge: 2017-09-24 | Disposition: A | Discharge status: Home / Self Care | Payer: PRIVATE HEALTH INSURANCE

## 2017-09-24 ENCOUNTER — Ambulatory Visit
Admission: RE | Admit: 2017-09-24 | Discharge: 2017-09-24 | Disposition: A | Discharge status: Home / Self Care | Payer: MEDICAID

## 2017-09-24 DIAGNOSIS — C50311 Malignant neoplasm of lower-inner quadrant of right female breast: Principal | ICD-10-CM

## 2017-09-24 DIAGNOSIS — Z171 Estrogen receptor negative status [ER-]: Secondary | ICD-10-CM

## 2017-09-24 DIAGNOSIS — Z510 Encounter for antineoplastic radiation therapy: Principal | ICD-10-CM

## 2017-09-24 DIAGNOSIS — Z79899 Other long term (current) drug therapy: Secondary | ICD-10-CM

## 2017-09-24 DIAGNOSIS — Z5181 Encounter for therapeutic drug level monitoring: Secondary | ICD-10-CM

## 2017-09-29 DIAGNOSIS — Z171 Estrogen receptor negative status [ER-]: Secondary | ICD-10-CM

## 2017-09-29 DIAGNOSIS — C50311 Malignant neoplasm of lower-inner quadrant of right female breast: Principal | ICD-10-CM

## 2017-10-01 DIAGNOSIS — C50311 Malignant neoplasm of lower-inner quadrant of right female breast: Principal | ICD-10-CM

## 2017-10-01 DIAGNOSIS — Z171 Estrogen receptor negative status [ER-]: Secondary | ICD-10-CM

## 2017-10-10 ENCOUNTER — Ambulatory Visit
Admission: RE | Admit: 2017-10-10 | Discharge: 2017-10-10 | Disposition: A | Discharge status: Home / Self Care | Payer: PRIVATE HEALTH INSURANCE

## 2017-10-10 DIAGNOSIS — Z170 Estrogen receptor positive status [ER+]: Secondary | ICD-10-CM

## 2017-10-10 DIAGNOSIS — C50911 Malignant neoplasm of unspecified site of right female breast: Principal | ICD-10-CM

## 2017-10-15 ENCOUNTER — Ambulatory Visit
Admission: RE | Admit: 2017-10-15 | Discharge: 2017-10-15 | Disposition: A | Discharge status: Home / Self Care | Payer: PRIVATE HEALTH INSURANCE

## 2017-10-15 ENCOUNTER — Ambulatory Visit
Admission: RE | Admit: 2017-10-15 | Discharge: 2017-10-15 | Disposition: A | Discharge status: Home / Self Care | Payer: BLUE CROSS/BLUE SHIELD

## 2017-10-15 DIAGNOSIS — C50311 Malignant neoplasm of lower-inner quadrant of right female breast: Principal | ICD-10-CM

## 2017-10-15 DIAGNOSIS — Z171 Estrogen receptor negative status [ER-]: Secondary | ICD-10-CM

## 2017-11-05 ENCOUNTER — Ambulatory Visit
Admission: RE | Admit: 2017-11-05 | Discharge: 2017-11-05 | Disposition: A | Discharge status: Home / Self Care | Payer: MEDICAID

## 2017-11-05 ENCOUNTER — Ambulatory Visit
Admission: RE | Admit: 2017-11-05 | Discharge: 2017-11-05 | Disposition: A | Discharge status: Home / Self Care | Payer: PRIVATE HEALTH INSURANCE | Admitting: Hematology & Oncology

## 2017-11-05 DIAGNOSIS — C50311 Malignant neoplasm of lower-inner quadrant of right female breast: Secondary | ICD-10-CM

## 2017-11-05 DIAGNOSIS — Z171 Estrogen receptor negative status [ER-]: Secondary | ICD-10-CM

## 2017-11-05 DIAGNOSIS — Z5112 Encounter for antineoplastic immunotherapy: Principal | ICD-10-CM

## 2017-11-26 ENCOUNTER — Ambulatory Visit
Admission: RE | Admit: 2017-11-26 | Discharge: 2017-11-26 | Disposition: A | Discharge status: Home / Self Care | Payer: PRIVATE HEALTH INSURANCE

## 2017-11-26 ENCOUNTER — Ambulatory Visit
Admission: RE | Admit: 2017-11-26 | Discharge: 2017-11-26 | Disposition: A | Discharge status: Home / Self Care | Payer: PRIVATE HEALTH INSURANCE | Attending: Family | Admitting: Family

## 2017-11-26 DIAGNOSIS — C50311 Malignant neoplasm of lower-inner quadrant of right female breast: Principal | ICD-10-CM

## 2017-11-26 DIAGNOSIS — Z5112 Encounter for antineoplastic immunotherapy: Principal | ICD-10-CM

## 2017-11-26 DIAGNOSIS — Z171 Estrogen receptor negative status [ER-]: Secondary | ICD-10-CM

## 2017-12-17 ENCOUNTER — Ambulatory Visit: Admit: 2017-12-17 | Discharge: 2017-12-17 | Payer: PRIVATE HEALTH INSURANCE

## 2017-12-17 ENCOUNTER — Other Ambulatory Visit: Admit: 2017-12-17 | Discharge: 2017-12-17 | Payer: PRIVATE HEALTH INSURANCE

## 2017-12-17 DIAGNOSIS — Z171 Estrogen receptor negative status [ER-]: Secondary | ICD-10-CM

## 2017-12-17 DIAGNOSIS — C50311 Malignant neoplasm of lower-inner quadrant of right female breast: Principal | ICD-10-CM

## 2017-12-17 DIAGNOSIS — Z79899 Other long term (current) drug therapy: Secondary | ICD-10-CM

## 2017-12-17 DIAGNOSIS — Z5181 Encounter for therapeutic drug level monitoring: Principal | ICD-10-CM

## 2018-01-07 ENCOUNTER — Other Ambulatory Visit: Admit: 2018-01-07 | Discharge: 2018-01-07 | Payer: PRIVATE HEALTH INSURANCE

## 2018-01-07 ENCOUNTER — Ambulatory Visit: Admit: 2018-01-07 | Discharge: 2018-01-07 | Payer: PRIVATE HEALTH INSURANCE

## 2018-01-07 DIAGNOSIS — Z171 Estrogen receptor negative status [ER-]: Secondary | ICD-10-CM

## 2018-01-07 DIAGNOSIS — C50311 Malignant neoplasm of lower-inner quadrant of right female breast: Principal | ICD-10-CM

## 2018-01-28 ENCOUNTER — Ambulatory Visit
Admit: 2018-01-28 | Discharge: 2018-01-29 | Payer: PRIVATE HEALTH INSURANCE | Attending: Hematology & Oncology | Primary: Hematology & Oncology

## 2018-01-28 ENCOUNTER — Ambulatory Visit: Admit: 2018-01-28 | Discharge: 2018-01-29 | Payer: PRIVATE HEALTH INSURANCE | Attending: Family | Primary: Family

## 2018-01-28 ENCOUNTER — Ambulatory Visit: Admit: 2018-01-28 | Discharge: 2018-01-29 | Payer: PRIVATE HEALTH INSURANCE

## 2018-01-28 DIAGNOSIS — C50311 Malignant neoplasm of lower-inner quadrant of right female breast: Principal | ICD-10-CM

## 2018-01-28 DIAGNOSIS — Z171 Estrogen receptor negative status [ER-]: Secondary | ICD-10-CM

## 2018-02-05 ENCOUNTER — Ambulatory Visit: Admit: 2018-02-05 | Discharge: 2018-02-06 | Payer: PRIVATE HEALTH INSURANCE

## 2018-02-05 DIAGNOSIS — Z171 Estrogen receptor negative status [ER-]: Secondary | ICD-10-CM

## 2018-02-05 DIAGNOSIS — Z0000 Encounter for general adult medical examination without abnormal findings: Secondary | ICD-10-CM

## 2018-02-05 DIAGNOSIS — C50311 Malignant neoplasm of lower-inner quadrant of right female breast: Principal | ICD-10-CM

## 2018-02-05 DIAGNOSIS — Z1211 Encounter for screening for malignant neoplasm of colon: Secondary | ICD-10-CM

## 2018-02-05 DIAGNOSIS — R7303 Prediabetes: Secondary | ICD-10-CM

## 2018-02-05 DIAGNOSIS — R87610 Atypical squamous cells of undetermined significance on cytologic smear of cervix (ASC-US): Secondary | ICD-10-CM

## 2018-02-18 ENCOUNTER — Ambulatory Visit: Admit: 2018-02-18 | Discharge: 2018-02-18 | Payer: PRIVATE HEALTH INSURANCE

## 2018-02-18 ENCOUNTER — Other Ambulatory Visit: Admit: 2018-02-18 | Discharge: 2018-02-18 | Payer: PRIVATE HEALTH INSURANCE

## 2018-02-18 DIAGNOSIS — Z171 Estrogen receptor negative status [ER-]: Secondary | ICD-10-CM

## 2018-02-18 DIAGNOSIS — C50311 Malignant neoplasm of lower-inner quadrant of right female breast: Secondary | ICD-10-CM

## 2018-02-18 DIAGNOSIS — Z5112 Encounter for antineoplastic immunotherapy: Principal | ICD-10-CM

## 2018-02-23 ENCOUNTER — Ambulatory Visit
Admit: 2018-02-23 | Discharge: 2018-03-24 | Payer: PRIVATE HEALTH INSURANCE | Attending: Rehabilitative and Restorative Service Providers" | Primary: Rehabilitative and Restorative Service Providers"

## 2018-02-23 DIAGNOSIS — C50311 Malignant neoplasm of lower-inner quadrant of right female breast: Principal | ICD-10-CM

## 2018-02-23 DIAGNOSIS — Z171 Estrogen receptor negative status [ER-]: Secondary | ICD-10-CM

## 2018-03-04 ENCOUNTER — Ambulatory Visit
Admit: 2018-03-04 | Discharge: 2018-03-08 | Payer: PRIVATE HEALTH INSURANCE | Attending: Radiation Oncology | Primary: Radiation Oncology

## 2018-03-04 DIAGNOSIS — Z171 Estrogen receptor negative status [ER-]: Secondary | ICD-10-CM

## 2018-03-04 DIAGNOSIS — C50311 Malignant neoplasm of lower-inner quadrant of right female breast: Principal | ICD-10-CM

## 2018-03-05 DIAGNOSIS — C50311 Malignant neoplasm of lower-inner quadrant of right female breast: Principal | ICD-10-CM

## 2018-03-05 DIAGNOSIS — I890 Lymphedema, not elsewhere classified: Secondary | ICD-10-CM

## 2018-03-05 DIAGNOSIS — Z171 Estrogen receptor negative status [ER-]: Secondary | ICD-10-CM

## 2018-03-11 ENCOUNTER — Ambulatory Visit: Admit: 2018-03-11 | Discharge: 2018-03-12 | Payer: PRIVATE HEALTH INSURANCE

## 2018-03-11 ENCOUNTER — Other Ambulatory Visit: Admit: 2018-03-11 | Discharge: 2018-03-12 | Payer: PRIVATE HEALTH INSURANCE

## 2018-03-11 DIAGNOSIS — Z5112 Encounter for antineoplastic immunotherapy: Principal | ICD-10-CM

## 2018-03-11 DIAGNOSIS — C50311 Malignant neoplasm of lower-inner quadrant of right female breast: Principal | ICD-10-CM

## 2018-03-11 DIAGNOSIS — Z171 Estrogen receptor negative status [ER-]: Secondary | ICD-10-CM

## 2018-03-12 DIAGNOSIS — C50311 Malignant neoplasm of lower-inner quadrant of right female breast: Principal | ICD-10-CM

## 2018-03-12 DIAGNOSIS — Z171 Estrogen receptor negative status [ER-]: Secondary | ICD-10-CM

## 2018-03-12 DIAGNOSIS — I890 Lymphedema, not elsewhere classified: Secondary | ICD-10-CM

## 2018-03-19 DIAGNOSIS — Z171 Estrogen receptor negative status [ER-]: Secondary | ICD-10-CM

## 2018-03-19 DIAGNOSIS — I890 Lymphedema, not elsewhere classified: Secondary | ICD-10-CM

## 2018-03-19 DIAGNOSIS — C50311 Malignant neoplasm of lower-inner quadrant of right female breast: Principal | ICD-10-CM

## 2018-04-01 ENCOUNTER — Ambulatory Visit
Admit: 2018-04-01 | Discharge: 2018-04-02 | Payer: PRIVATE HEALTH INSURANCE | Attending: Hematology & Oncology | Primary: Hematology & Oncology

## 2018-04-01 ENCOUNTER — Ambulatory Visit: Admit: 2018-04-01 | Discharge: 2018-04-02 | Payer: PRIVATE HEALTH INSURANCE

## 2018-04-01 ENCOUNTER — Other Ambulatory Visit: Admit: 2018-04-01 | Discharge: 2018-04-02 | Payer: PRIVATE HEALTH INSURANCE

## 2018-04-01 DIAGNOSIS — C50311 Malignant neoplasm of lower-inner quadrant of right female breast: Principal | ICD-10-CM

## 2018-04-01 DIAGNOSIS — Z171 Estrogen receptor negative status [ER-]: Secondary | ICD-10-CM

## 2018-04-01 DIAGNOSIS — Z5112 Encounter for antineoplastic immunotherapy: Principal | ICD-10-CM

## 2018-04-02 ENCOUNTER — Ambulatory Visit
Admit: 2018-04-02 | Discharge: 2018-04-23 | Payer: PRIVATE HEALTH INSURANCE | Attending: Rehabilitative and Restorative Service Providers" | Primary: Rehabilitative and Restorative Service Providers"

## 2018-04-02 DIAGNOSIS — I890 Lymphedema, not elsewhere classified: Secondary | ICD-10-CM

## 2018-04-02 DIAGNOSIS — C50311 Malignant neoplasm of lower-inner quadrant of right female breast: Principal | ICD-10-CM

## 2018-04-02 DIAGNOSIS — Z171 Estrogen receptor negative status [ER-]: Secondary | ICD-10-CM

## 2018-04-09 DIAGNOSIS — Z171 Estrogen receptor negative status [ER-]: Secondary | ICD-10-CM

## 2018-04-09 DIAGNOSIS — I890 Lymphedema, not elsewhere classified: Secondary | ICD-10-CM

## 2018-04-09 DIAGNOSIS — C50311 Malignant neoplasm of lower-inner quadrant of right female breast: Principal | ICD-10-CM

## 2018-04-10 ENCOUNTER — Ambulatory Visit: Admit: 2018-04-10 | Discharge: 2018-04-10 | Payer: PRIVATE HEALTH INSURANCE

## 2018-04-10 DIAGNOSIS — C50911 Malignant neoplasm of unspecified site of right female breast: Principal | ICD-10-CM

## 2018-04-10 DIAGNOSIS — Z171 Estrogen receptor negative status [ER-]: Secondary | ICD-10-CM

## 2018-04-16 DIAGNOSIS — C50311 Malignant neoplasm of lower-inner quadrant of right female breast: Principal | ICD-10-CM

## 2018-04-16 DIAGNOSIS — I890 Lymphedema, not elsewhere classified: Secondary | ICD-10-CM

## 2018-04-16 DIAGNOSIS — Z171 Estrogen receptor negative status [ER-]: Secondary | ICD-10-CM

## 2018-04-22 ENCOUNTER — Ambulatory Visit: Admit: 2018-04-22 | Discharge: 2018-04-22 | Payer: PRIVATE HEALTH INSURANCE

## 2018-04-22 ENCOUNTER — Other Ambulatory Visit: Admit: 2018-04-22 | Discharge: 2018-04-22 | Payer: PRIVATE HEALTH INSURANCE

## 2018-04-22 DIAGNOSIS — C50311 Malignant neoplasm of lower-inner quadrant of right female breast: Principal | ICD-10-CM

## 2018-04-22 DIAGNOSIS — Z171 Estrogen receptor negative status [ER-]: Secondary | ICD-10-CM

## 2018-04-22 DIAGNOSIS — Z5112 Encounter for antineoplastic immunotherapy: Principal | ICD-10-CM

## 2018-04-30 IMAGING — US US CAROTID DUPLEX BILAT
1 series · 13 of 24 positions shown · non-contrast
Comparison: No recent prior.

CLINICAL DATA: Recent TIA.

EXAM:
BILATERAL CAROTID DUPLEX ULTRASOUND
TECHNIQUE: Gray scale imaging, color Doppler and duplex ultrasound were
performed of bilateral carotid and vertebral arteries in the neck.

[Series 1: us carotid duplex bilat · 0.06mm/px · 13 of 73 slices shown]
[im 1/73]
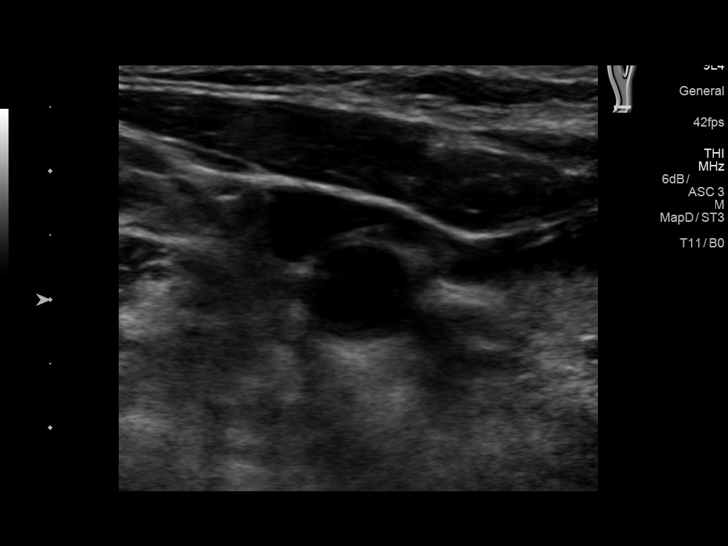
[im 7/73]
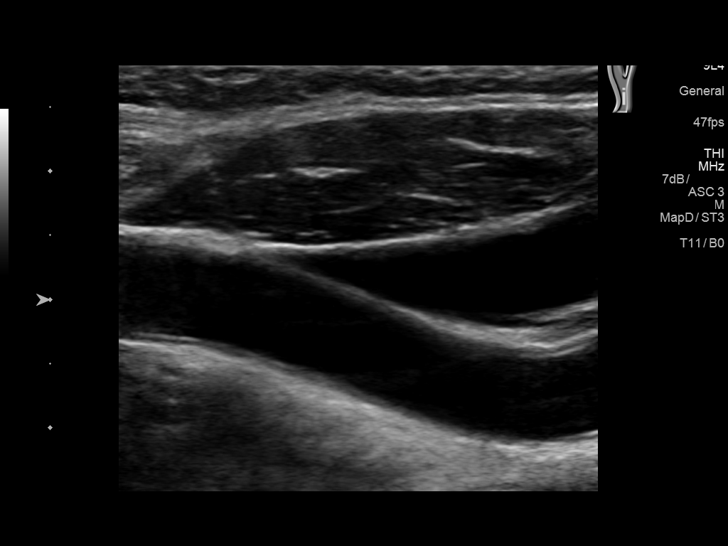
[im 13/73]
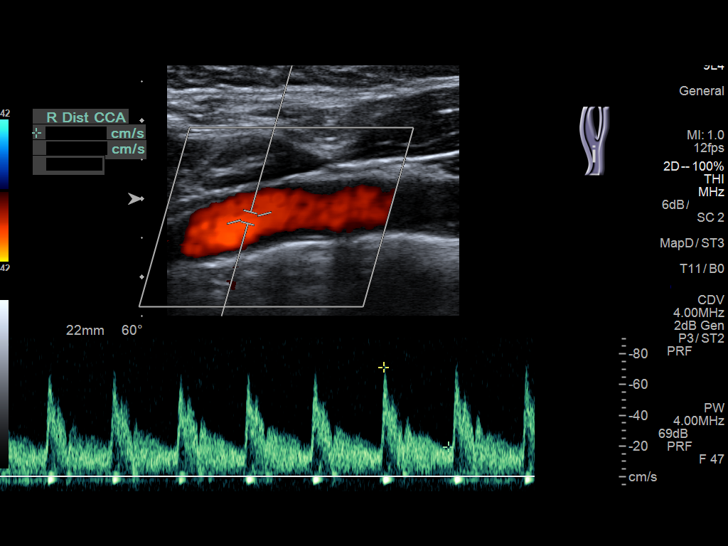
[im 19/73]
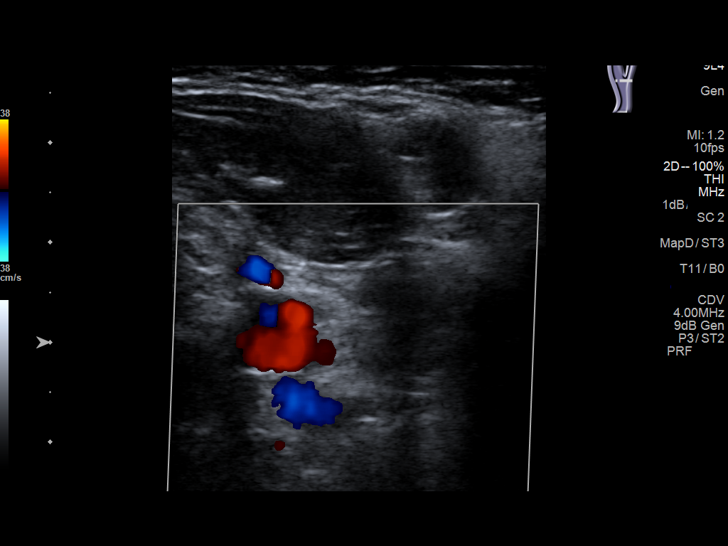
[im 26/73]
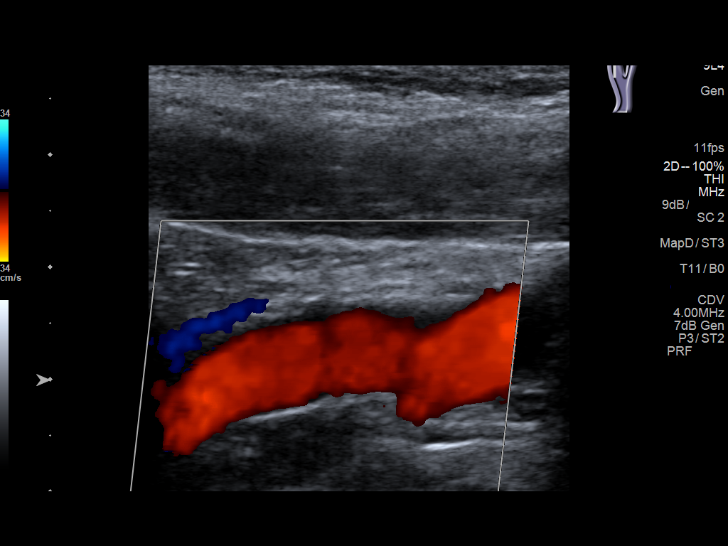
[im 32/73]
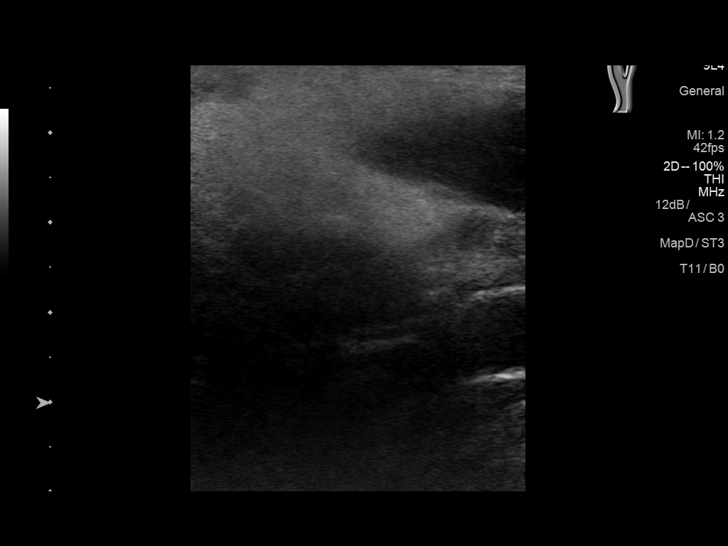
[im 38/73]
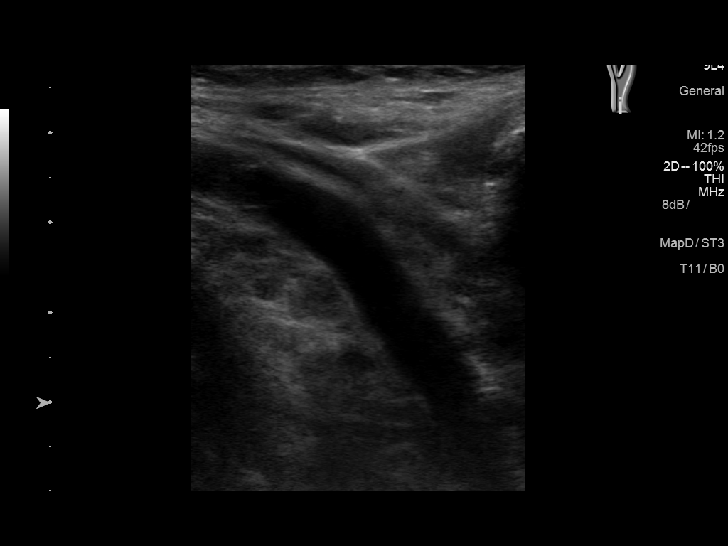
[im 41/73]
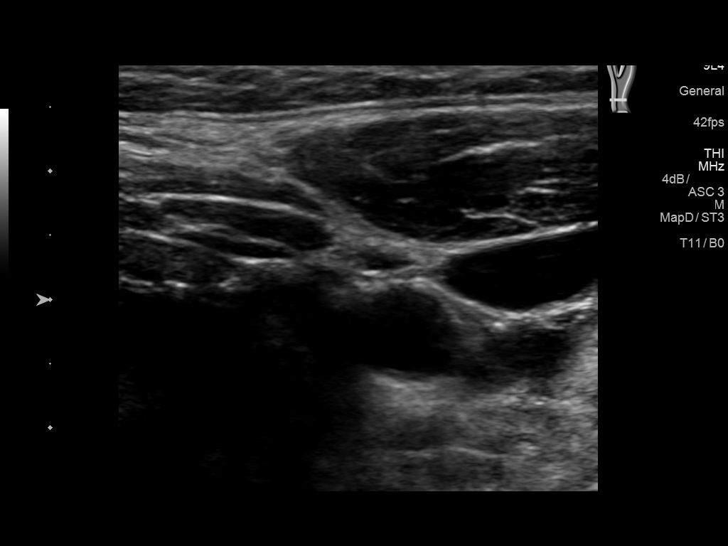
[im 47/73]
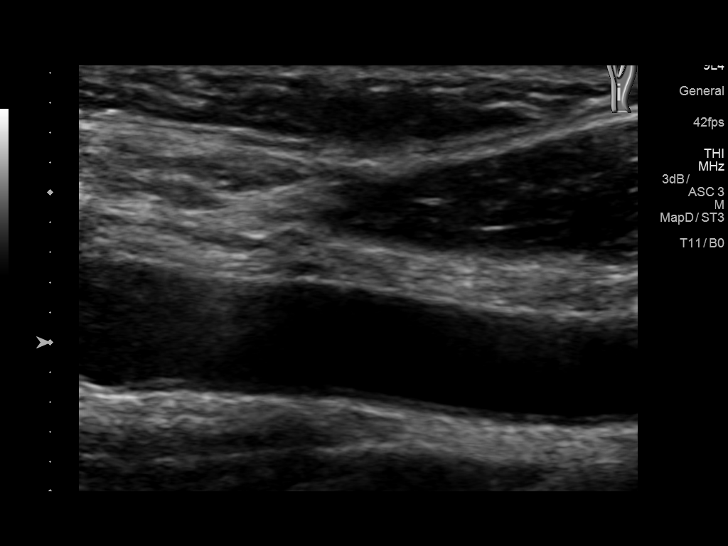
[im 54/73]
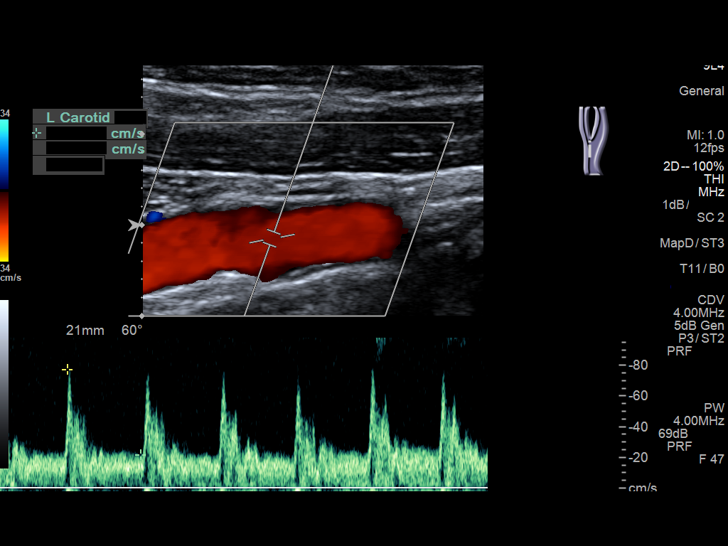
[im 60/73]
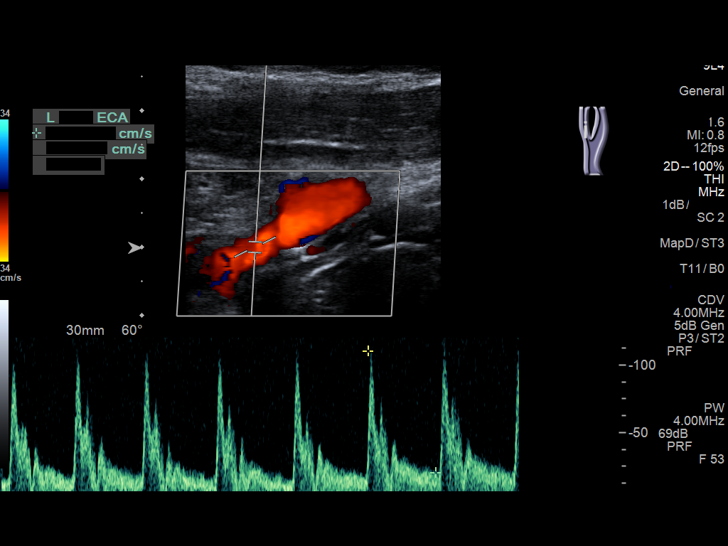
[im 66/73]
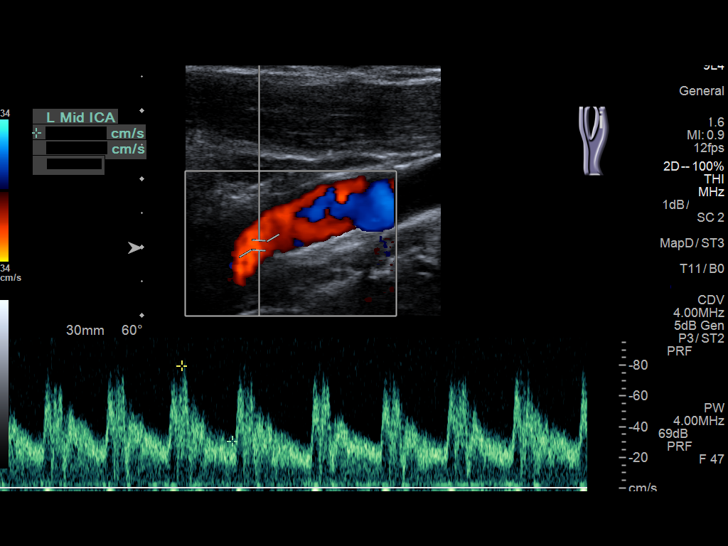
[im 73/73]
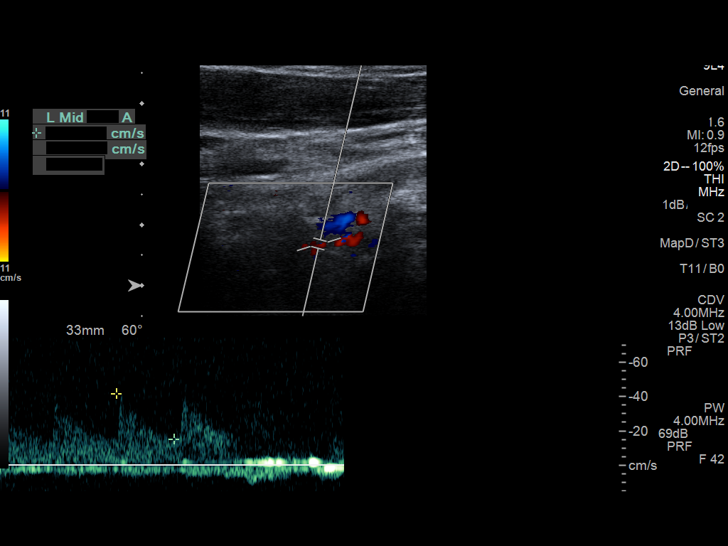

[13 of 24 positions shown; findings below may reference images not displayed]

FINDINGS: Criteria: Quantification of carotid stenosis is based on velocity
parameters that correlate the residual internal carotid diameter
with NASCET-based stenosis levels, using the diameter of the distal
internal carotid lumen as the denominator for stenosis measurement.

The following velocity measurements were obtained:

RIGHT

ICA:  48/13 cm/sec

CCA:  100/22 cm/sec

SYSTOLIC ICA/CCA RATIO:

DIASTOLIC ICA/CCA RATIO:

ECA:  99 cm/sec

LEFT

ICA:  80/33 cm/sec

CCA:  102/24 cm/sec

SYSTOLIC ICA/CCA RATIO:

DIASTOLIC ICA/CCA RATIO:

ECA:  110 cm/sec

RIGHT CAROTID ARTERY: No significant carotid atherosclerotic
vascular disease. No flow limiting stenosis.

RIGHT VERTEBRAL ARTERY:  Patent with antegrade flow.

LEFT CAROTID ARTERY: No significant left carotid atherosclerotic
vascular disease. No flow limiting stenosis.

LEFT VERTEBRAL ARTERY:  Patent with antegrade flow.
IMPRESSION: 1. No significant carotid atherosclerotic vascular disease. No flow
limiting stenosis.

2. Vertebral artery patent antegrade flow.

## 2018-05-13 ENCOUNTER — Ambulatory Visit
Admit: 2018-05-13 | Discharge: 2018-05-13 | Disposition: A | Discharge status: Home / Self Care | Payer: PRIVATE HEALTH INSURANCE

## 2018-05-13 ENCOUNTER — Emergency Department
Admit: 2018-05-13 | Discharge: 2018-05-13 | Disposition: A | Discharge status: Home / Self Care | Payer: PRIVATE HEALTH INSURANCE

## 2018-05-13 DIAGNOSIS — R51 Headache: Principal | ICD-10-CM

## 2018-05-13 DIAGNOSIS — H5789 Other specified disorders of eye and adnexa: Principal | ICD-10-CM

## 2018-05-13 MED ORDER — CYCLOPENTOLATE 1 % EYE DROPS
Freq: Three times a day (TID) | OPHTHALMIC | 0 refills | 0.00000 days | Status: CP
Start: 2018-05-13 — End: 2018-06-03

## 2018-05-13 MED ORDER — PREDNISOLONE ACETATE 1 % EYE DROPS,SUSPENSION
OPHTHALMIC | 0 refills | 0.00000 days | Status: CP
Start: 2018-05-13 — End: 2018-06-03

## 2018-05-13 MED ORDER — PREDNISONE 20 MG TABLET
ORAL_TABLET | Freq: Every day | ORAL | 0 refills | 0.00000 days | Status: CP
Start: 2018-05-13 — End: 2018-05-20

## 2018-05-13 MED ORDER — CEPHALEXIN 500 MG CAPSULE
ORAL_CAPSULE | Freq: Four times a day (QID) | ORAL | 0 refills | 0.00000 days | Status: CP
Start: 2018-05-13 — End: 2018-05-18

## 2018-05-20 ENCOUNTER — Ambulatory Visit: Admit: 2018-05-20 | Discharge: 2018-05-21 | Payer: PRIVATE HEALTH INSURANCE

## 2018-05-20 DIAGNOSIS — R42 Dizziness and giddiness: Secondary | ICD-10-CM

## 2018-05-20 DIAGNOSIS — H209 Unspecified iridocyclitis: Principal | ICD-10-CM

## 2018-05-20 DIAGNOSIS — H5712 Ocular pain, left eye: Secondary | ICD-10-CM

## 2018-06-03 ENCOUNTER — Ambulatory Visit: Admit: 2018-06-03 | Discharge: 2018-06-04 | Payer: PRIVATE HEALTH INSURANCE

## 2018-06-03 DIAGNOSIS — H209 Unspecified iridocyclitis: Principal | ICD-10-CM

## 2018-07-09 ENCOUNTER — Ambulatory Visit: Payer: Managed Care, Other (non HMO) | Admitting: Neurology

## 2018-07-09 ENCOUNTER — Encounter: Payer: Self-pay | Admitting: Neurology

## 2018-07-09 VITALS — BP 123/81 | HR 91 | Ht 64.0 in | Wt 258.0 lb

## 2018-07-09 DIAGNOSIS — G43829 Menstrual migraine, not intractable, without status migrainosus: Secondary | ICD-10-CM | POA: Diagnosis not present

## 2018-07-09 MED ORDER — RIZATRIPTAN BENZOATE 10 MG PO TBDP: 10.0000 mg | ORAL_TABLET | Freq: Three times a day (TID) | ORAL | 3 refills | Status: AC | PRN

## 2018-07-09 MED ORDER — TOPIRAMATE 25 MG PO TABS: ORAL_TABLET | ORAL | 3 refills | Status: AC

## 2018-07-09 NOTE — Progress Notes (Signed)
Reason for visit: Headache  Kristina Graham is an 52 y.o. female  History of present illness:  Kristina Graham is a 52 year old right-handed black female with a history of migraine headaches.  The patient was seen previously through this office in 2018.  The patient has headaches that are primarily around the right frontotemporal area and may be associated with ptosis of the right eye.  MRI of the brain and MRA of the head was ordered but was never done.  The patient apparently has been treated for breast cancer, stage I.  She has had a lumpectomy and chemotherapy.  She has had ongoing problems with her headaches, she gets about 5-6 headaches a month.  The patient currently takes over-the-counter anti-inflammatory medications for the headache.  She recent was diagnosed with a left uveitis.  The patient did have an MRI of the brain and MRA of the head done in June 2019, the studies were unremarkable.  The patient in the past has had sensations of throat tightening on Imitrex injectable medication.  The patient has never been on a daily medication to prevent the headache.  The patient currently seeing a chiropractor.  She returns for an evaluation.  Past Medical History:  Diagnosis Date  . Breast cancer ([place])    stage 1  . Dysplasia of cervix   . HPV (human papilloma virus) infection   . Iron deficiency anemia   . Knee pain    Left  . Leiomyoma of uterus   . Menstrual migraine 03/04/2017  . Migraine   . Obesity   . Uveitis    left    Past Surgical History:  Procedure Laterality Date  . FOOT SURGERY Bilateral 2013  . HALLUX VALGUS CORRECTION      Family History  Problem Relation Age of Onset  . Diabetes Mother   . Heart disease Mother   . Hypertension Mother   . COPD Mother   . Cancer Mother   . Diabetes Sister   . Diabetes Maternal Grandmother   . Heart disease Maternal Grandmother   . Hypertension Maternal Grandmother   . Cancer Maternal Grandmother     Social history:   reports that she has never smoked. She has never used smokeless tobacco. She reports that she does not drink alcohol or use drugs.    Allergies  Allergen Reactions  . Sumatriptan Succinate     Other reaction(s): SWELLING/EDEMA    Medications:  Prior to Admission medications   Medication Sig Start Date End Date Taking? Authorizing Provider  ferrous gluconate (FERGON) 324 MG tablet Take 1 tablet (324 mg total) by mouth daily with breakfast. 03/04/17  Yes Kathrynn Ducking, MD  rizatriptan (MAXALT-MLT) 10 MG disintegrating tablet Take 1 tablet (10 mg total) by mouth 3 (three) times daily as needed for migraine. May repeat in 2 hours if needed 07/09/18   Kathrynn Ducking, MD  topiramate (TOPAMAX) 25 MG tablet Take one tablet at night for one week, then take 2 tablets at night for one week, then take 3 tablets at night. 07/09/18   Kathrynn Ducking, MD    ROS:  Out of a complete 14 system review of symptoms, the patient complains only of the following symptoms, and all other reviewed systems are negative.  Decreased activity, fatigue, weight gain Ringing in the ears Eye redness, light sensitivity, double vision, eye pain, blurred vision Daytime sleepiness, snoring Frequent infections Decreased urine Joint pain, joint swelling, back pain, walking difficulty Itching Swollen  lymph nodes Memory loss, headache, speech difficulty  Blood pressure 123/81, pulse 91, height 5\' 4"  (1.626 m), weight 258 lb (117 kg).  Physical Exam  General: The patient is alert and cooperative at the time of the examination.  The patient is markedly obese.  Skin: No significant peripheral edema is noted.   Neurologic Exam  Mental status: The patient is alert and oriented x 3 at the time of the examination. The patient has apparent normal recent and remote memory, with an apparently normal attention span and concentration ability.   Cranial nerves: Facial symmetry is present. Speech is normal, no aphasia or  dysarthria is noted. Extraocular movements are full. Visual fields are full.  Motor: The patient has good strength in all 4 extremities.  Sensory examination: Soft touch sensation is symmetric on the face, arms, and legs.  Coordination: The patient has good finger-nose-finger and heel-to-shin bilaterally.  Gait and station: The patient has a normal gait. Tandem gait is normal. Romberg is negative. No drift is seen.  Reflexes: Deep tendon reflexes are symmetric.   Assessment/Plan:  1.  Migraine headache  The patient is having 1 or 2 headaches a week, we will initiate a daily prophylactic medication.  We will start with Topamax, working up to 75 mg at night.  The patient will be given Maxalt to take if needed for the headache, hopefully avoiding the sensation of throat tightness.  The patient will follow-up in about 4 months, sooner if needed.  She will call for any dose adjustments of the medication.  The patient may occasionally miss work because of the headache.  Jill Alexanders MD 07/09/2018 8:18 AM  [facility] [address] [place], [place] [address]  Phone [phone] Fax [phone]

## 2018-07-09 NOTE — Patient Instructions (Signed)
We will start Topamax for the headache prevention, use Maxalt if needed.  Topamax (topiramate) is a seizure medication that has an FDA approval for seizures and for migraine headache. Potential side effects of this medication include weight loss, cognitive slowing, tingling in the fingers and toes, and carbonated drinks will taste bad. If any significant side effects are noted on this drug, please contact our office.

## 2018-08-21 ENCOUNTER — Ambulatory Visit
Admit: 2018-08-21 | Discharge: 2018-08-22 | Payer: PRIVATE HEALTH INSURANCE | Attending: Student in an Organized Health Care Education/Training Program | Primary: Student in an Organized Health Care Education/Training Program

## 2018-08-21 DIAGNOSIS — H209 Unspecified iridocyclitis: Principal | ICD-10-CM

## 2018-08-21 MED ORDER — PREDNISOLONE ACETATE 1 % EYE DROPS,SUSPENSION
OPHTHALMIC | 0 refills | 0 days | Status: CP
Start: 2018-08-21 — End: 2018-09-03

## 2018-08-21 MED ORDER — CYCLOPENTOLATE 1 % EYE DROPS
Freq: Three times a day (TID) | OPHTHALMIC | 0 refills | 0.00000 days | Status: CP
Start: 2018-08-21 — End: ?

## 2018-08-24 ENCOUNTER — Ambulatory Visit: Admit: 2018-08-24 | Discharge: 2018-08-25 | Payer: PRIVATE HEALTH INSURANCE

## 2018-08-24 DIAGNOSIS — H209 Unspecified iridocyclitis: Principal | ICD-10-CM

## 2018-08-25 ENCOUNTER — Encounter: Payer: Self-pay | Admitting: Emergency Medicine

## 2018-08-25 ENCOUNTER — Other Ambulatory Visit: Payer: Self-pay

## 2018-08-25 ENCOUNTER — Ambulatory Visit: Payer: Self-pay | Admitting: Family Medicine

## 2018-08-25 ENCOUNTER — Emergency Department
Admission: EM | Admit: 2018-08-25 | Discharge: 2018-08-25 | Disposition: A | Discharge status: Home / Self Care | Payer: Managed Care, Other (non HMO) | Attending: Emergency Medicine | Admitting: Emergency Medicine

## 2018-08-25 VITALS — BP 129/73 | HR 94 | Temp 98.7°F | Resp 20

## 2018-08-25 DIAGNOSIS — R350 Frequency of micturition: Secondary | ICD-10-CM

## 2018-08-25 DIAGNOSIS — M545 Low back pain: Secondary | ICD-10-CM | POA: Diagnosis present

## 2018-08-25 DIAGNOSIS — Z853 Personal history of malignant neoplasm of breast: Secondary | ICD-10-CM | POA: Diagnosis not present

## 2018-08-25 DIAGNOSIS — N390 Urinary tract infection, site not specified: Secondary | ICD-10-CM

## 2018-08-25 DIAGNOSIS — Z79899 Other long term (current) drug therapy: Secondary | ICD-10-CM | POA: Diagnosis not present

## 2018-08-25 DIAGNOSIS — R300 Dysuria: Secondary | ICD-10-CM

## 2018-08-25 LAB — URINALYSIS, COMPLETE (UACMP) WITH MICROSCOPIC
BACTERIA UA: NONE SEEN
Specific Gravity, Urine: 1.028 (ref 1.005–1.030)

## 2018-08-25 LAB — POCT URINALYSIS DIPSTICK
Blood, UA: NEGATIVE
GLUCOSE UA: POSITIVE — AB
Protein, UA: POSITIVE — AB
SPEC GRAV UA: 1.015 (ref 1.010–1.025)
Urobilinogen, UA: 4 E.U./dL — AB
pH, UA: 5 (ref 5.0–8.0)

## 2018-08-25 LAB — COMPREHENSIVE METABOLIC PANEL
ALT: 20 U/L (ref 0–44)
AST: 19 U/L (ref 15–41)
Albumin: 4.3 g/dL (ref 3.5–5.0)
Alkaline Phosphatase: 62 U/L (ref 38–126)
Anion gap: 10 (ref 5–15)
BUN: 21 mg/dL — ABNORMAL HIGH (ref 6–20)
CHLORIDE: 107 mmol/L (ref 98–111)
CO2: 22 mmol/L (ref 22–32)
Calcium: 9.6 mg/dL (ref 8.9–10.3)
Creatinine, Ser: 1.34 mg/dL — ABNORMAL HIGH (ref 0.44–1.00)
GFR, EST AFRICAN AMERICAN: 53 mL/min — AB (ref >60)
GFR, EST NON AFRICAN AMERICAN: 45 mL/min — AB (ref >60)
Glucose, Bld: 88 mg/dL (ref 70–99)
POTASSIUM: 3.8 mmol/L (ref 3.5–5.1)
Sodium: 139 mmol/L (ref 135–145)
Total Bilirubin: 0.6 mg/dL (ref 0.3–1.2)
Total Protein: 8.2 g/dL — ABNORMAL HIGH (ref 6.5–8.1)

## 2018-08-25 LAB — CBC
HEMATOCRIT: 35.6 % (ref 35.0–47.0)
Hemoglobin: 11.9 g/dL — ABNORMAL LOW (ref 12.0–16.0)
MCH: 26.6 pg (ref 26.0–34.0)
MCHC: 33.5 g/dL (ref 32.0–36.0)
MCV: 79.2 fL — AB (ref 80.0–100.0)
Platelets: 196 10*3/uL (ref 150–440)
RBC: 4.49 MIL/uL (ref 3.80–5.20)
RDW: 16.2 % — ABNORMAL HIGH (ref 11.5–14.5)
WBC: 6.8 10*3/uL (ref 3.6–11.0)

## 2018-08-25 LAB — LIPASE, BLOOD: LIPASE: 43 U/L (ref 11–51)

## 2018-08-25 MED ORDER — SODIUM CHLORIDE 0.9 % IV SOLN
1.0000 g | Freq: Once | INTRAVENOUS | Status: AC
  Administered 2018-08-25: 1 g via INTRAVENOUS
  Filled 2018-08-25: qty 10

## 2018-08-25 MED ORDER — SULFAMETHOXAZOLE-TRIMETHOPRIM 800-160 MG PO TABS: 1.0000 | ORAL_TABLET | Freq: Two times a day (BID) | ORAL | 0 refills | Status: DC

## 2018-08-25 MED ORDER — KETOROLAC TROMETHAMINE 30 MG/ML IJ SOLN
30.0000 mg | Freq: Once | INTRAMUSCULAR | Status: AC
  Administered 2018-08-25: 30 mg via INTRAVENOUS
  Filled 2018-08-25: qty 1

## 2018-08-25 MED ORDER — NAPROXEN 500 MG PO TABS: 500.0000 mg | ORAL_TABLET | Freq: Two times a day (BID) | ORAL | 0 refills | Status: DC

## 2018-08-25 NOTE — Progress Notes (Signed)
Subjective: abdominal pain    Kristina Graham is a 52 y.o. female who complains of bilateral flank pain, urinary frequency, nausea, pain in the lower abdomen, fever (T-max unknown), chills, malaise, vomiting and dizziness for 5 days.  Patient denies hematuria or changes in/abnormal vaginal discharge. Patient does not have a history of recurrent UTI.  Patient does not have a history of pyelonephritis.  Treatment attempted at home: Azo.  Patient reports a history of breast cancer.  Last chemo was in May.  Review of Systems Pertinent items noted in HPI and remainder of comprehensive ROS otherwise negative.    Objective:    BP 129/73 (BP Location: Left Arm, Patient Position: Sitting, Cuff Size: Large)   Pulse 94   Temp 98.7 F (37.1 C) (Oral)   Resp 20   SpO2 98%  General: alert, cooperative, appears stated age and no distress  Abdomen: soft, nondistended, normal bowel sounds, tenderness moderate in the lower abdomen, without guarding, without rebound, no masses palpated and without organomegaly   Back: bilateral CVA tenderness present  GU: defer exam - unable to perform in clinic    Laboratory:  Urine dipstick shows red color, clear clarity, positive for glucose (1+), 2+ bilirubin, 1+ ketones, 1.015 specific gravity, negative for blood, 5.0 pH, 2+ protein, 4.0 urobilinogen, positive for nitrites, 3+ leukocytes.   Assessment:   Urinary tract infection- concern for pyelonephritis  Plan: Plan:   Sent the patient to the emergency department for further evaluation and treatment.  Patient accompanied by Abigail Butts, RN.  Report given to ED nurse.

## 2018-08-25 NOTE — Discharge Instructions (Addendum)
Follow-up with your primary care provider if any continued problems.  You may also follow-up with [facility] acute care if needed.  Increase fluids.  Begin taking antibiotics for the next 10 days.  Naproxen 500 mg twice daily with food.  You may also continue taking your medication for nausea if needed at home.  Return to the emergency department if any severe worsening of your symptoms.

## 2018-08-25 NOTE — ED Triage Notes (Addendum)
Pt to ED via POV was sent from employee health with possible kidney infection per pt. PT c/o back pain and nausea xfew days. PT states increased urination. VSS . Denies fever . Pt had blood work yesterday, results show + nitrate

## 2018-08-25 NOTE — ED Provider Notes (Signed)
[facility] Emergency Department Provider Note   ____________________________________________   First MD Initiated Contact with Patient 08/25/18 1228     (approximate)  I have reviewed the triage vital signs and the nursing notes.   HISTORY  Chief Complaint Back Pain and Nausea   HPI Kristina [place] is a 52 y.o. female presents to the ED with complaint of low back pain for 2 days.  Patient states that she had an appointment with employee health at 18 and told that she possibly had a kidney infection and that she would need to go to the emergency department for treatment.  Patient states there is been some nausea for several days but no vomiting.  She has increased urination but denies hematuria.  No fever, chills or body aches.  Patient/urinary tract infection was 1 year ago.  She denies any injury to her back but has had low back issues in the past.  She denies any paresthesias or incontinence of bowel or bladder.  She rates her pain as 6.  Currently she denies any nausea.  Patient has been taking Azo-Standard over-the-counter for urinary symptoms.  Past Medical History:  Diagnosis Date  . Breast cancer ([place])    stage 1  . Dysplasia of cervix   . HPV (human papilloma virus) infection   . Iron deficiency anemia   . Knee pain    Left  . Leiomyoma of uterus   . Menstrual migraine 03/04/2017  . Migraine   . Obesity   . Uveitis    left    Patient Active Problem List   Diagnosis Date Noted  . Menstrual migraine 03/04/2017  . Iron deficiency anemia 03/04/2017    Past Surgical History:  Procedure Laterality Date  . FOOT SURGERY Bilateral 2013  . HALLUX VALGUS CORRECTION      Prior to Admission medications   Medication Sig Start Date End Date Taking? Authorizing Provider  cyclopentolate (CYCLODRYL,CYCLOGYL) 1 % ophthalmic solution Administer 1 drop into the left eye Three (3) times a day. 08/21/18   [provider]  ferrous gluconate  (FERGON) 324 MG tablet Take 1 tablet (324 mg total) by mouth daily with breakfast. 03/04/17   Kathrynn Ducking, MD  mupirocin ointment (BACTROBAN) 2 % Place into the nose. 07/19/17   [provider]  naproxen (NAPROSYN) 500 MG tablet Take 1 tablet (500 mg total) by mouth 2 (two) times daily with a meal. 08/25/18   Letitia Neri L, PA-C  prednisoLONE acetate (PRED FORTE) 1 % ophthalmic suspension Administer 1 drop into the left eye Every two (2) hours. Administer 1 drop every 1 hour while awake on Friday 9/13 followed by every 2 hours 08/21/18   [provider]  rizatriptan (MAXALT-MLT) 10 MG disintegrating tablet Take 1 tablet (10 mg total) by mouth 3 (three) times daily as needed for migraine. May repeat in 2 hours if needed 07/09/18   Kathrynn Ducking, MD  sodium chloride flush 0.9 % SOLN injection Inject into the vein. 06/27/17   [provider]  sulfamethoxazole-trimethoprim (BACTRIM DS,SEPTRA DS) 800-160 MG tablet Take 1 tablet by mouth 2 (two) times daily. 08/25/18   Johnn Hai, PA-C  topiramate (TOPAMAX) 25 MG tablet Take one tablet at night for one week, then take 2 tablets at night for one week, then take 3 tablets at night. Patient not taking: Reported on 08/25/2018 07/09/18   Kathrynn Ducking, MD    Allergies Sumatriptan succinate  Family History  Problem  Relation Age of Onset  . Diabetes Mother   . Heart disease Mother   . Hypertension Mother   . COPD Mother   . Cancer Mother   . Diabetes Sister   . Diabetes Maternal Grandmother   . Heart disease Maternal Grandmother   . Hypertension Maternal Grandmother   . Cancer Maternal Grandmother     Social History Social History   Tobacco Use  . Smoking status: Never Smoker  . Smokeless tobacco: Never Used  Substance Use Topics  . Alcohol use: No  . Drug use: No    Review of Systems Constitutional: No fever/chills Cardiovascular: Denies chest pain. Respiratory: Denies shortness of  breath. Gastrointestinal: No abdominal pain.  Positive nausea, no vomiting.  No diarrhea.  Genitourinary: Positive for urinary frequency and negative for hematuria.  Negative for history of kidney stone. Musculoskeletal: Positive for low back pain. Skin: Negative for rash. Neurological: Negative for headaches, focal weakness or numbness. ____________________________________________   PHYSICAL EXAM:  VITAL SIGNS: ED Triage Vitals [08/25/18 1148]  Enc Vitals Group     BP 133/84     Pulse Rate 93     Resp 16     Temp 98.7 F (37.1 C)     Temp Source Oral     SpO2 100 %     Weight 252 lb (114.3 kg)     Height 5\' 4"  (1.626 m)     Head Circumference      Peak Flow      Pain Score 6     Pain Loc      Pain Edu?      Excl. in [place]?    Constitutional: Alert and oriented. Well appearing and in no acute distress. Eyes: Conjunctivae are normal.  Head: Atraumatic. Neck: No stridor.   Cardiovascular: Normal rate, regular rhythm. Grossly normal heart sounds.  Good peripheral circulation. Respiratory: Normal respiratory effort.  No retractions. Lungs CTAB. Gastrointestinal: Soft and nontender. No distention. No CVA tenderness. Musculoskeletal: There is some bilateral low back tenderness on palpation but no flank pain or CVA tenderness is noted.  Range of motion is minimally restricted and no muscle spasms were seen.  Patient is able to move upper and lower extremities without any difficulty and ambulate without any assistance. Neurologic:  Normal speech and language. No gross focal neurologic deficits are appreciated. No gait instability. Skin:  Skin is warm, dry and intact. No rash noted. Psychiatric: Mood and affect are normal. Speech and behavior are normal.  ____________________________________________   LABS (all labs ordered are listed, but only abnormal results are displayed)  Labs Reviewed  COMPREHENSIVE METABOLIC PANEL - Abnormal; Notable for the following components:       Result Value   BUN 21 (*)    Creatinine, Ser 1.34 (*)    Total Protein 8.2 (*)    GFR calc non Af Amer 45 (*)    GFR calc Af Amer 53 (*)    All other components within normal limits  CBC - Abnormal; Notable for the following components:   Hemoglobin 11.9 (*)    MCV 79.2 (*)    RDW 16.2 (*)    All other components within normal limits  URINALYSIS, COMPLETE (UACMP) WITH MICROSCOPIC - Abnormal; Notable for the following components:   Color, Urine ORANGE (*)    Glucose, UA   (*)    Value: TEST NOT REPORTED DUE TO COLOR INTERFERENCE OF URINE PIGMENT   Hgb urine dipstick   (*)    Value:  TEST NOT REPORTED DUE TO COLOR INTERFERENCE OF URINE PIGMENT   Bilirubin Urine   (*)    Value: TEST NOT REPORTED DUE TO COLOR INTERFERENCE OF URINE PIGMENT   Ketones, ur   (*)    Value: TEST NOT REPORTED DUE TO COLOR INTERFERENCE OF URINE PIGMENT   Protein, ur   (*)    Value: TEST NOT REPORTED DUE TO COLOR INTERFERENCE OF URINE PIGMENT   Nitrite   (*)    Value: TEST NOT REPORTED DUE TO COLOR INTERFERENCE OF URINE PIGMENT   Leukocytes, UA   (*)    Value: TEST NOT REPORTED DUE TO COLOR INTERFERENCE OF URINE PIGMENT   All other components within normal limits  URINE CULTURE  LIPASE, BLOOD  POC URINE PREG, ED    PROCEDURES  Procedure(s) performed: None  Procedures  Critical Care performed: No  ____________________________________________   INITIAL IMPRESSION / ASSESSMENT AND PLAN / ED COURSE  As part of my medical decision making, I reviewed the following data within the electronic MEDICAL RECORD NUMBER Notes from prior ED visits and [place] Controlled Substance Database  Patient presents to the ED with complaint of low back pain for 2 days along with nausea but no vomiting.  She denies any fever or chills.  She was sent here from employee health after her urinalysis showed WBCs and RBCs with positive nitrites and leukocytes.  Patient states that she was sent because they did not know what antibiotic  would be the best to place her on.  Because of the nausea patient was started on Rocephin 1 g on the along with Toradol 30 mg IV.  Patient was improved prior to discharge.  She may continue taking Azo-Standard for dysuria.  Patient was given a prescription for naproxen 500 mg twice daily with food and Bactrim DS twice daily for the next 10 days.  A urine culture was ordered.  Patient is to follow-up with her PCP or [facility] if any continued problems.  ____________________________________________   FINAL CLINICAL IMPRESSION(S) / ED DIAGNOSES  Final diagnoses:  Acute urinary tract infection     ED Discharge Orders         Ordered    sulfamethoxazole-trimethoprim (BACTRIM DS,SEPTRA DS) 800-160 MG tablet  2 times daily     08/25/18 1429    naproxen (NAPROSYN) 500 MG tablet  2 times daily with meals     08/25/18 1429           Note:  This document was prepared using Dragon voice recognition software and may include unintentional dictation errors.    Johnn Hai, PA-C 08/25/18 1513    Lisa Roca, MD 08/25/18 [phone]

## 2018-08-25 NOTE — ED Notes (Signed)
Blanket provided per patient request.

## 2018-08-25 NOTE — ED Notes (Signed)
See triage note. Pt in for low back pain x2 days. Pt reports initial Sx similar to UTI sx. Self Tx with OTC ibuprofen at 0700 this AM and an OTC UTI med that turned her urine orange.

## 2018-08-27 LAB — URINE CULTURE
Culture: NO GROWTH
Special Requests: NORMAL

## 2018-09-03 ENCOUNTER — Ambulatory Visit
Admit: 2018-09-03 | Discharge: 2018-09-04 | Payer: PRIVATE HEALTH INSURANCE | Attending: Ophthalmology | Primary: Ophthalmology

## 2018-09-03 DIAGNOSIS — H209 Unspecified iridocyclitis: Principal | ICD-10-CM

## 2018-09-03 MED ORDER — PREDNISOLONE ACETATE 1 % EYE DROPS,SUSPENSION
OPHTHALMIC | 0 refills | 0 days | Status: CP
Start: 2018-09-03 — End: 2019-06-04

## 2018-09-10 ENCOUNTER — Ambulatory Visit: Admit: 2018-09-10 | Discharge: 2018-09-11 | Payer: PRIVATE HEALTH INSURANCE

## 2018-09-10 DIAGNOSIS — Z1211 Encounter for screening for malignant neoplasm of colon: Principal | ICD-10-CM

## 2018-09-15 ENCOUNTER — Ambulatory Visit: Admit: 2018-09-15 | Discharge: 2018-09-16 | Payer: PRIVATE HEALTH INSURANCE

## 2018-09-15 DIAGNOSIS — H209 Unspecified iridocyclitis: Principal | ICD-10-CM

## 2018-09-15 DIAGNOSIS — M2550 Pain in unspecified joint: Secondary | ICD-10-CM

## 2018-10-21 ENCOUNTER — Ambulatory Visit
Admit: 2018-10-21 | Discharge: 2018-11-07 | Payer: PRIVATE HEALTH INSURANCE | Attending: Radiation Oncology | Primary: Radiation Oncology

## 2018-10-21 DIAGNOSIS — C50311 Malignant neoplasm of lower-inner quadrant of right female breast: Principal | ICD-10-CM

## 2018-10-21 DIAGNOSIS — Z171 Estrogen receptor negative status [ER-]: Secondary | ICD-10-CM

## 2018-10-27 ENCOUNTER — Ambulatory Visit: Admit: 2018-10-27 | Discharge: 2018-10-28 | Payer: PRIVATE HEALTH INSURANCE

## 2018-10-27 ENCOUNTER — Ambulatory Visit: Admit: 2018-10-27 | Discharge: 2018-10-28 | Payer: PRIVATE HEALTH INSURANCE | Attending: Family | Primary: Family

## 2018-10-27 DIAGNOSIS — Z171 Estrogen receptor negative status [ER-]: Secondary | ICD-10-CM

## 2018-10-27 DIAGNOSIS — C50311 Malignant neoplasm of lower-inner quadrant of right female breast: Principal | ICD-10-CM

## 2018-10-29 ENCOUNTER — Encounter
Admit: 2018-10-29 | Discharge: 2018-10-29 | Payer: PRIVATE HEALTH INSURANCE | Attending: Anesthesiology | Primary: Anesthesiology

## 2018-10-29 ENCOUNTER — Ambulatory Visit: Admit: 2018-10-29 | Discharge: 2018-10-29 | Payer: PRIVATE HEALTH INSURANCE

## 2018-10-29 DIAGNOSIS — Z1211 Encounter for screening for malignant neoplasm of colon: Principal | ICD-10-CM

## 2018-10-29 DIAGNOSIS — D369 Benign neoplasm, unspecified site: Secondary | ICD-10-CM

## 2018-10-31 ENCOUNTER — Ambulatory Visit
Admission: EM | Admit: 2018-10-31 | Discharge: 2018-10-31 | Disposition: A | Discharge status: Home / Self Care | Payer: Managed Care, Other (non HMO) | Attending: Family Medicine | Admitting: Family Medicine

## 2018-10-31 DIAGNOSIS — N760 Acute vaginitis: Secondary | ICD-10-CM

## 2018-10-31 DIAGNOSIS — R300 Dysuria: Secondary | ICD-10-CM | POA: Diagnosis not present

## 2018-10-31 DIAGNOSIS — M545 Low back pain, unspecified: Secondary | ICD-10-CM

## 2018-10-31 LAB — URINALYSIS, COMPLETE (UACMP) WITH MICROSCOPIC
Bacteria, UA: NONE SEEN
Bilirubin Urine: NEGATIVE
Glucose, UA: NEGATIVE mg/dL
Hgb urine dipstick: NEGATIVE
Ketones, ur: NEGATIVE mg/dL
Leukocytes, UA: NEGATIVE
Nitrite: NEGATIVE
Protein, ur: NEGATIVE mg/dL
RBC / HPF: NONE SEEN RBC/hpf (ref 0–5)
Specific Gravity, Urine: >1.03 — ABNORMAL HIGH (ref 1.005–1.030)
pH: 5.5 (ref 5.0–8.0)

## 2018-10-31 MED ORDER — FLUCONAZOLE 150 MG PO TABS: 150.0000 mg | ORAL_TABLET | Freq: Once | ORAL | 0 refills | Status: AC

## 2018-10-31 MED ORDER — CYCLOBENZAPRINE HCL 10 MG PO TABS: 10.0000 mg | ORAL_TABLET | Freq: Every evening | ORAL | 0 refills | Status: DC | PRN

## 2018-10-31 NOTE — ED Provider Notes (Signed)
[facility] ____________________________________________  Time seen: Approximately 9:37 AM  I have reviewed the triage vital signs and the nursing notes.   HISTORY  Chief Complaint Urinary Tract Infection   HPI Kristina [place] is a 52 y.o. female presenting for evaluation of a few days of some urinary frequency as well as slight vaginal irritation.  Also reports for a few days noticing some lower back pain.  Patient reports that she has her chronic back pain issues due to muscle issues, and states that she follows with a chiropractor but has not been seen in a while.  Denies any fall, injury or known trigger.  States pain is reproduced with twisting and moving.  States does bother her some at night try to get comfortable.  Did take ibuprofen this morning which has helped.  Denies any pain radiation, paresthesias, urinary or bowel retention or incontinence, chest pain, shortness of breath, abdominal pain, fevers or other complaints.  Denies vaginal discharge but states slight irritation.  No burning with urination.  States wanted to make sure no UTI.  Denies other aggravating alleviating factors.  Reports otherwise feels well.  Emelia Salisbury, MD: PCP    Past Medical History:  Diagnosis Date  . Breast cancer ([place])    stage 1  . Dysplasia of cervix   . HPV (human papilloma virus) infection   . Iron deficiency anemia   . Knee pain    Left  . Leiomyoma of uterus   . Menstrual migraine 03/04/2017  . Migraine   . Obesity   . Uveitis    left    Patient Active Problem List   Diagnosis Date Noted  . Menstrual migraine 03/04/2017  . Iron deficiency anemia 03/04/2017    Past Surgical History:  Procedure Laterality Date  . FOOT SURGERY Bilateral 2013  . HALLUX VALGUS CORRECTION       No current facility-administered medications for this encounter.   Current Outpatient Medications:  .  cyclopentolate (CYCLODRYL,CYCLOGYL) 1 % ophthalmic solution, Administer 1 drop  into the left eye Three (3) times a day., Disp: , Rfl:  .  ferrous gluconate (FERGON) 324 MG tablet, Take 1 tablet (324 mg total) by mouth daily with breakfast., Disp: 30 tablet, Rfl: 3 .  mupirocin ointment (BACTROBAN) 2 %, Place into the nose., Disp: , Rfl:  .  naproxen (NAPROSYN) 500 MG tablet, Take 1 tablet (500 mg total) by mouth 2 (two) times daily with a meal., Disp: 20 tablet, Rfl: 0 .  prednisoLONE acetate (PRED FORTE) 1 % ophthalmic suspension, Administer 1 drop into the left eye Every two (2) hours. Administer 1 drop every 1 hour while awake on Friday 9/13 followed by every 2 hours, Disp: , Rfl:  .  rizatriptan (MAXALT-MLT) 10 MG disintegrating tablet, Take 1 tablet (10 mg total) by mouth 3 (three) times daily as needed for migraine. May repeat in 2 hours if needed, Disp: 9 tablet, Rfl: 3 .  topiramate (TOPAMAX) 25 MG tablet, Take one tablet at night for one week, then take 2 tablets at night for one week, then take 3 tablets at night., Disp: 90 tablet, Rfl: 3 .  cyclobenzaprine (FLEXERIL) 10 MG tablet, Take 1 tablet (10 mg total) by mouth at bedtime as needed for muscle spasms. Do not drive while taking as can cause drowsiness, Disp: 15 tablet, Rfl: 0 .  fluconazole (DIFLUCAN) 150 MG tablet, Take 1 tablet (150 mg total) by mouth once for 1 dose., Disp: 1 tablet, Rfl: 0 .  sodium chloride flush 0.9 % SOLN injection, Inject into the vein., Disp: , Rfl:  .  sulfamethoxazole-trimethoprim (BACTRIM DS,SEPTRA DS) 800-160 MG tablet, Take 1 tablet by mouth 2 (two) times daily., Disp: 20 tablet, Rfl: 0  Allergies Sumatriptan succinate  Family History  Problem Relation Age of Onset  . Diabetes Mother   . Heart disease Mother   . Hypertension Mother   . COPD Mother   . Cancer Mother   . Diabetes Sister   . Diabetes Maternal Grandmother   . Heart disease Maternal Grandmother   . Hypertension Maternal Grandmother   . Cancer Maternal Grandmother     Social History Social History    Tobacco Use  . Smoking status: Never Smoker  . Smokeless tobacco: Never Used  Substance Use Topics  . Alcohol use: No  . Drug use: No    Review of Systems Constitutional: No fever Cardiovascular: Denies chest pain. Respiratory: Denies shortness of breath. Gastrointestinal: No abdominal pain.  No nausea, no vomiting.  No diarrhea.  Genitourinary: positive for dysuria. Musculoskeletal: positive for back pain. Skin: Negative for rash.  ____________________________________________   PHYSICAL EXAM:  VITAL SIGNS: ED Triage Vitals  Enc Vitals Group     BP 10/31/18 0839 105/73     Pulse Rate 10/31/18 0839 80     Resp 10/31/18 0839 18     Temp 10/31/18 0839 98.5 F (36.9 C)     Temp Source 10/31/18 0839 Oral     SpO2 10/31/18 0839 100 %     Weight --      Height --      Head Circumference --      Peak Flow --      Pain Score 10/31/18 0841 5     Pain Loc --      Pain Edu? --      Excl. in [place]? --     Constitutional: Alert and oriented. Well appearing and in no acute distress. ENT      Head: Normocephalic and atraumatic. Cardiovascular: Normal rate, regular rhythm. Grossly normal heart sounds.  Good peripheral circulation. Respiratory: Normal respiratory effort without tachypnea nor retractions. Breath sounds are clear and equal bilaterally. No wheezes, rales, rhonchi. Gastrointestinal: Soft and nontender. No CVA tenderness. Musculoskeletal: No midline cervical or thoracic tenderness palpation.  Mild lower midline as well as bilateral paralumbar tenderness to palpation diffusely, no point tenderness, mild pain with lumbar flexion and extension but full range of motion present, minimal pain with right and left lumbar rotation.  No pain with standing bilateral knee lifts.  No pain and bilateral plantar flexion and dorsiflexion strong and equal.  No saddle anesthesia.  Changes positions quickly in room with steady gait. Neurologic:  Normal speech and language. No gross focal  neurologic deficits are appreciated. Speech is normal. No gait instability.  Skin:  Skin is warm, dry and intact. No rash noted. Psychiatric: Mood and affect are normal. Speech and behavior are normal. Patient exhibits appropriate insight and judgment   ___________________________________________   LABS (all labs ordered are listed, but only abnormal results are displayed)  Labs Reviewed  URINALYSIS, COMPLETE (UACMP) WITH MICROSCOPIC - Abnormal; Notable for the following components:      Result Value   Specific Gravity, Urine >1.030 (*)    All other components within normal limits  URINE CULTURE     PROCEDURES Procedures   INITIAL IMPRESSION / ASSESSMENT AND PLAN / ED COURSE  Pertinent labs & imaging results that were available during my  care of the patient were reviewed by me and considered in my medical decision making (see chart for details).  Well-appearing patient.  No acute distress.  Urinalysis results, discussed with patient, doubt UTI.  Will culture.  Yeast noted on urine, patient did report some vaginal irritation.  Will empirically treat with Diflucan.  Suspect muscular skeletal back pain.  PRN Flexeril at night, over-the-counter Tylenol ibuprofen as needed during the daytime.  Patient reports similar to previous back issues and will follow up with a chiropractor as needed.  Encourage rest, fluids, supportive care and stretching.Discussed indication, risks and benefits of medications with patient.  Discussed follow up with Primary care physician this week as needed. Discussed follow up and return parameters including no resolution or any worsening concerns. Patient verbalized understanding and agreed to plan.   ____________________________________________   FINAL CLINICAL IMPRESSION(S) / ED DIAGNOSES  Final diagnoses:  Acute bilateral low back pain without sciatica  Dysuria  Acute vaginitis     ED Discharge Orders         Ordered    cyclobenzaprine (FLEXERIL) 10  MG tablet  At bedtime PRN     10/31/18 0915    fluconazole (DIFLUCAN) 150 MG tablet   Once     10/31/18 0915           Note: This dictation was prepared with Dragon dictation along with smaller phrase technology. Any transcriptional errors that result from this process are unintentional.         Marylene Land, NP 10/31/18 [phone]

## 2018-10-31 NOTE — Discharge Instructions (Addendum)
Take medication as prescribed. Rest. Drink plenty of fluids.  ° °Follow up with your primary care physician this week as needed. Return to Urgent care for new or worsening concerns.  ° °

## 2018-10-31 NOTE — ED Triage Notes (Signed)
Pt here for possible UTI for a few days. Complaining of flank pain that radiates to her middle back. Also complains of urgency and frequency. Has been taking azo, last dose was Tuesday.

## 2018-11-03 LAB — URINE CULTURE: Culture: 30000 — AB

## 2018-11-04 ENCOUNTER — Telehealth (HOSPITAL_COMMUNITY): Payer: Self-pay | Admitting: Emergency Medicine

## 2018-11-04 MED ORDER — NITROFURANTOIN MONOHYD MACRO 100 MG PO CAPS: 100.0000 mg | ORAL_CAPSULE | Freq: Two times a day (BID) | ORAL | 0 refills | Status: DC

## 2018-11-04 NOTE — Telephone Encounter (Signed)
Urine culture was positive e coli which was not treated at Urgent Care.  Prescription for nitrofurantoin 100mg  PO BID x5 days per protocol sent to pharmacy of choice. Pt called and made aware. Pt educated to follow up if symptoms are not improving. Verbalized understanding.

## 2018-11-10 ENCOUNTER — Ambulatory Visit: Payer: Managed Care, Other (non HMO) | Admitting: Adult Health

## 2018-11-19 ENCOUNTER — Ambulatory Visit: Payer: Self-pay | Admitting: Emergency Medicine

## 2018-11-19 VITALS — BP 122/86 | HR 90 | Temp 98.6°F | Resp 14

## 2018-11-19 DIAGNOSIS — R300 Dysuria: Secondary | ICD-10-CM

## 2018-11-19 LAB — POCT URINALYSIS DIPSTICK
BILIRUBIN UA: NEGATIVE
Glucose, UA: NEGATIVE
KETONES UA: NEGATIVE
Leukocytes, UA: NEGATIVE
Nitrite, UA: NEGATIVE
PH UA: 5 (ref 5.0–8.0)
PROTEIN UA: NEGATIVE
Urobilinogen, UA: 0.2 E.U./dL

## 2018-11-19 MED ORDER — NITROFURANTOIN MONOHYD MACRO 100 MG PO CAPS: 100.0000 mg | ORAL_CAPSULE | Freq: Two times a day (BID) | ORAL | 0 refills | Status: DC

## 2018-11-19 NOTE — Progress Notes (Signed)
Subjective. Patient has been bothered with recurrent urinary tract infections over the last year.  She states she has had 4-5 urinary tract infections this year.  Her last urinary tract infection was in November of this year 10/31/2018.  Urine culture grew E. Coli 30,000 colonies per milliliter.  Previous urine culture which was done 08/25/2018 was negative.  She also has been treated for yeast with Diflucan.  She states that when she takes antibiotics she feels like her infection gets better as well as her urinary symptoms resolved.  She is currently under treatment for uveitis involving her left eye. Review of systems. Currently under treatment for uveitis left eye with steroid drops. Currently under treatment for breast cancer. Not currently under treatment for atrophic vaginitis complicated by her known breast cancer and chemotherapy. Back she has an aching and stiffness in her low back. Objective. Alert and cooperative not in any distress. Chest clear to auscultation. Abdomen very mild suprapubic tenderness. Back mild tenderness over the lower lumbar spine negative straight leg raising. Urine specific gravity 1.030- nitrite negative leukocyte negative. Assessment. I suspect she has atrophic vaginitis with recurrent urinary symptoms.  I have gone ahead and send off a urine culture her last urine culture grew 30,000 colonies of E. coli. The urine culture prior to this was negative. Plan. Force fluids. Azo-Standard for 3 days. Monistat cream to apply to the vaginal area daily for 1 week. Urine culture was done. Follow-up with her primary care physician.

## 2018-11-19 NOTE — Patient Instructions (Signed)
We will call you with culture results. You have a prescription for Macrobid but do not get it filled until we call you with culture results. Make an appointment to see your family doctor regarding possible atrophic vaginitis. Apply Monistat cream to the vaginal opening once daily for 7 days   Dysuria Dysuria is pain or discomfort while urinating. The pain or discomfort may be felt in the tube that carries urine out of the bladder (urethra) or in the surrounding tissue of the genitals. The pain may also be felt in the groin area, lower abdomen, and lower back. You may have to urinate frequently or have the sudden feeling that you have to urinate (urgency). Dysuria can affect both men and women, but is more common in women. Dysuria can be caused by many different things, including:  Urinary tract infection in women.  Infection of the kidney or bladder.  Kidney stones or bladder stones.  Certain sexually transmitted infections (STIs), such as chlamydia.  Dehydration.  Inflammation of the vagina.  Use of certain medicines.  Use of certain soaps or scented products that cause irritation.  Follow these instructions at home: Watch your dysuria for any changes. The following actions may help to reduce any discomfort you are feeling:  Drink enough fluid to keep your urine clear or pale yellow.  Empty your bladder often. Avoid holding urine for long periods of time.  After a bowel movement or urination, women should cleanse from front to back, using each tissue only once.  Empty your bladder after sexual intercourse.  Take medicines only as directed by your health care provider.  If you were prescribed an antibiotic medicine, finish it all even if you start to feel better.  Avoid caffeine, tea, and alcohol. They can irritate the bladder and make dysuria worse. In men, alcohol may irritate the prostate.  Keep all follow-up visits as directed by your health care provider. This is  important.  If you had any tests done to find the cause of dysuria, it is your responsibility to obtain your test results. Ask the lab or department performing the test when and how you will get your results. Talk with your health care provider if you have any questions about your results.  Contact a health care provider if:  You develop pain in your back or sides.  You have a fever.  You have nausea or vomiting.  You have blood in your urine.  You are not urinating as often as you usually do. Get help right away if:  You pain is severe and not relieved with medicines.  You are unable to hold down any fluids.  You or someone else notices a change in your mental function.  You have a rapid heartbeat at rest.  You have shaking or chills.  You feel extremely weak. This information is not intended to replace advice given to you by your health care provider. Make sure you discuss any questions you have with your health care provider. Document Released: 08/23/2004 Document Revised: 05/02/2016 Document Reviewed: 07/21/2014 Elsevier Interactive Patient Education  [employer].

## 2018-11-22 LAB — URINE CULTURE

## 2019-01-26 ENCOUNTER — Ambulatory Visit
Admit: 2019-01-26 | Discharge: 2019-01-27 | Payer: PRIVATE HEALTH INSURANCE | Attending: Hematology & Oncology | Primary: Hematology & Oncology

## 2019-01-26 DIAGNOSIS — C50311 Malignant neoplasm of lower-inner quadrant of right female breast: Principal | ICD-10-CM

## 2019-01-26 DIAGNOSIS — Z171 Estrogen receptor negative status [ER-]: Secondary | ICD-10-CM

## 2019-02-04 ENCOUNTER — Emergency Department: Payer: Managed Care, Other (non HMO)

## 2019-02-04 ENCOUNTER — Emergency Department
Admission: EM | Admit: 2019-02-04 | Discharge: 2019-02-04 | Disposition: A | Discharge status: Home / Self Care | Payer: Managed Care, Other (non HMO) | Attending: Emergency Medicine | Admitting: Emergency Medicine

## 2019-02-04 ENCOUNTER — Encounter: Payer: Self-pay | Admitting: Emergency Medicine

## 2019-02-04 DIAGNOSIS — R079 Chest pain, unspecified: Secondary | ICD-10-CM | POA: Insufficient documentation

## 2019-02-04 DIAGNOSIS — Z5321 Procedure and treatment not carried out due to patient leaving prior to being seen by health care provider: Secondary | ICD-10-CM | POA: Insufficient documentation

## 2019-02-04 LAB — LIPASE, BLOOD: LIPASE: 40 U/L (ref 11–51)

## 2019-02-04 LAB — CBC
HEMATOCRIT: 35.9 % — AB (ref 36.0–46.0)
HEMOGLOBIN: 11.4 g/dL — AB (ref 12.0–15.0)
MCH: 25.5 pg — ABNORMAL LOW (ref 26.0–34.0)
MCHC: 31.8 g/dL (ref 30.0–36.0)
MCV: 80.3 fL (ref 80.0–100.0)
NRBC: 0 % (ref 0.0–0.2)
Platelets: 222 10*3/uL (ref 150–400)
RBC: 4.47 MIL/uL (ref 3.87–5.11)
RDW: 14.7 % (ref 11.5–15.5)
WBC: 5.3 10*3/uL (ref 4.0–10.5)

## 2019-02-04 LAB — COMPREHENSIVE METABOLIC PANEL
ALT: 17 U/L (ref 0–44)
AST: 16 U/L (ref 15–41)
Albumin: 4.1 g/dL (ref 3.5–5.0)
Alkaline Phosphatase: 58 U/L (ref 38–126)
Anion gap: 9 (ref 5–15)
BUN: 19 mg/dL (ref 6–20)
CHLORIDE: 106 mmol/L (ref 98–111)
CO2: 23 mmol/L (ref 22–32)
Calcium: 8.9 mg/dL (ref 8.9–10.3)
Creatinine, Ser: 1.11 mg/dL — ABNORMAL HIGH (ref 0.44–1.00)
GFR calc Af Amer: >60 mL/min (ref >60)
GFR, EST NON AFRICAN AMERICAN: 57 mL/min — AB (ref >60)
Glucose, Bld: 117 mg/dL — ABNORMAL HIGH (ref 70–99)
POTASSIUM: 4 mmol/L (ref 3.5–5.1)
SODIUM: 138 mmol/L (ref 135–145)
Total Bilirubin: 0.8 mg/dL (ref 0.3–1.2)
Total Protein: 8.2 g/dL — ABNORMAL HIGH (ref 6.5–8.1)

## 2019-02-04 MED ORDER — SODIUM CHLORIDE 0.9% FLUSH: 3.0000 mL | Freq: Once | INTRAVENOUS | Status: DC

## 2019-02-04 NOTE — ED Notes (Signed)
Consulted Dr. Joni Fears about patient complaints.  Orders received.

## 2019-02-04 NOTE — ED Notes (Signed)
Alert and oriented. NAD. Ambulatory. Unlabored.

## 2019-02-04 NOTE — ED Triage Notes (Signed)
Patient complaining of chest pain with "a lot of indigestion and stomach pain" starting a couple of days ago.  Here this AM because of nausea, indigestion, and HA.  Denies recent URI.  Alert and oriented, NAD.

## 2019-04-27 ENCOUNTER — Ambulatory Visit: Admit: 2019-04-27 | Discharge: 2019-04-27 | Payer: PRIVATE HEALTH INSURANCE

## 2019-04-27 ENCOUNTER — Telehealth
Admit: 2019-04-27 | Discharge: 2019-04-27 | Payer: PRIVATE HEALTH INSURANCE | Attending: Hematology & Oncology | Primary: Hematology & Oncology

## 2019-04-27 DIAGNOSIS — Z171 Estrogen receptor negative status [ER-]: Secondary | ICD-10-CM

## 2019-04-27 DIAGNOSIS — C50311 Malignant neoplasm of lower-inner quadrant of right female breast: Principal | ICD-10-CM

## 2019-05-25 ENCOUNTER — Institutional Professional Consult (permissible substitution): Admit: 2019-05-25 | Discharge: 2019-05-26 | Payer: PRIVATE HEALTH INSURANCE

## 2019-05-25 DIAGNOSIS — L309 Dermatitis, unspecified: Principal | ICD-10-CM

## 2019-05-25 DIAGNOSIS — H209 Unspecified iridocyclitis: Secondary | ICD-10-CM

## 2019-05-25 MED ORDER — TRIAMCINOLONE ACETONIDE 0.5 % TOPICAL OINTMENT
Freq: Two times a day (BID) | TOPICAL | 1 refills | 0.00000 days | Status: CP
Start: 2019-05-25 — End: ?

## 2019-06-04 ENCOUNTER — Telehealth: Admit: 2019-06-04 | Discharge: 2019-06-05 | Payer: PRIVATE HEALTH INSURANCE

## 2019-06-04 DIAGNOSIS — M2550 Pain in unspecified joint: Secondary | ICD-10-CM

## 2019-06-04 DIAGNOSIS — H209 Unspecified iridocyclitis: Principal | ICD-10-CM

## 2019-06-04 MED ORDER — MELOXICAM 15 MG TABLET
ORAL_TABLET | Freq: Every day | ORAL | 3 refills | 0 days | Status: CP
Start: 2019-06-04 — End: 2020-06-03

## 2019-08-04 ENCOUNTER — Ambulatory Visit
Admit: 2019-08-04 | Discharge: 2019-08-09 | Payer: PRIVATE HEALTH INSURANCE | Attending: Radiation Oncology | Primary: Radiation Oncology

## 2019-08-04 DIAGNOSIS — Z171 Estrogen receptor negative status [ER-]: Secondary | ICD-10-CM

## 2019-08-04 DIAGNOSIS — C50311 Malignant neoplasm of lower-inner quadrant of right female breast: Principal | ICD-10-CM

## 2019-09-27 ENCOUNTER — Ambulatory Visit: Admit: 2019-09-27 | Discharge: 2019-09-28 | Payer: PRIVATE HEALTH INSURANCE

## 2019-09-28 DIAGNOSIS — E1122 Type 2 diabetes mellitus with diabetic chronic kidney disease: Principal | ICD-10-CM

## 2019-09-28 MED ORDER — METFORMIN 500 MG TABLET: tablet | 0 refills | 0 days | Status: AC

## 2019-10-06 ENCOUNTER — Other Ambulatory Visit: Payer: Self-pay

## 2019-10-06 ENCOUNTER — Ambulatory Visit: Payer: Managed Care, Other (non HMO) | Admitting: Internal Medicine

## 2019-10-06 VITALS — BP 124/80 | HR 71 | Temp 97.7°F | Resp 18 | Ht 64.0 in | Wt 257.0 lb

## 2019-10-06 DIAGNOSIS — Z008 Encounter for other general examination: Secondary | ICD-10-CM

## 2019-10-06 DIAGNOSIS — Z6841 Body Mass Index (BMI) 40.0 and over, adult: Secondary | ICD-10-CM

## 2019-10-06 NOTE — Addendum Note (Signed)
Addended by: Jay Schlichter D on: 10/06/2019 01:41 PM   Modules accepted: Orders

## 2019-10-06 NOTE — Progress Notes (Signed)
[facility]  Patient:Kristina Graham, DOB: [birth year]  Subjective:  Here for Biometric Screen/brief exam Patient is a71 year old female in no acute distress who comes to the clinic for her biometric screening and brief exam. Patient regularly seesher PCP - Dr. Tamala Julian within [facility], and also her eye doctor and breast Cancer doctor in the [facility] system. Recently diagnosed with DM last visit with her PCP and put on Metformin. Also diagnosed with a rheumatic eye condition and put on Mobic recent past to help.  She is employed by [employer] and works as a Science writer work Librarian, academic. She reports she is currentlytrying toeat healthier, with a recent low carb diet tried,also walks at least 3 days a week for exercise, with goal of trying to lose weight as well.   She denies any concerns at today's visit. She reports she feels well.  Patient denies any fever, body aches,chills, rash, chest pain, shortness of breath, nausea, vomiting,abdominal painsor diarrhea. No tobacco history, Denies any drug use oralcohol useat present.  Allergies reviewed: Allergies  Allergen Reactions  . Metaclazepam Hives  . Sumatriptan Succinate     Other reaction(s): SWELLING/EDEMA    Medicationsreviewed: Current Outpatient Medications on File Prior to Visit  Medication Sig Dispense Refill  . cyclopentolate (CYCLODRYL,CYCLOGYL) 1 % ophthalmic solution Administer 1 drop into the left eye Three (3) times a day.    . ferrous gluconate (FERGON) 324 MG tablet Take 1 tablet (324 mg total) by mouth daily with breakfast. 30 tablet 3  . GAVILYTE-G 236 g solution     . mupirocin ointment (BACTROBAN) 2 % Place into the nose.    . naproxen (NAPROSYN) 500 MG tablet Take 1 tablet (500 mg total) by mouth 2 (two) times daily with a meal. 20 tablet 0  . nitrofurantoin, macrocrystal-monohydrate, (MACROBID) 100 MG capsule Take 1 capsule (100 mg total) by mouth 2 (two)  times daily. 10 capsule 0  . prednisoLONE acetate (PRED FORTE) 1 % ophthalmic suspension Administer 1 drop into the left eye Every two (2) hours. Administer 1 drop every 1 hour while awake on Friday 9/13 followed by every 2 hours    . rizatriptan (MAXALT-MLT) 10 MG disintegrating tablet Take 1 tablet (10 mg total) by mouth 3 (three) times daily as needed for migraine. May repeat in 2 hours if needed 9 tablet 3  . topiramate (TOPAMAX) 25 MG tablet Take one tablet at night for one week, then take 2 tablets at night for one week, then take 3 tablets at night. 90 tablet 3   No current facility-administered medications on file prior to visit.     Patient is no longer taking the gavilyte nor the cyclopentolate HCL eye drops.  She is now taking metformin and Mobic daily.  Problem list reviewed. .  Objective:  BP 124/80 (BP Location: Left Arm, Patient Position: Sitting, Cuff Size: Large)   Pulse 71   Temp 97.7 F (36.5 C) (Temporal)   Resp 18   Ht 5\' 4"  (1.626 m)   Wt 257 lb (116.6 kg)   LMP 07/13/2018   SpO2 99%   BMI 44.11 kg/m    NAD, masked, obese Patient is alert and oriented and responsive to questions Engages in eye contact with provider. Speaks in full sentences without any pauses without any shortness of breath or distress. HEENT: PERRL, EOMI, left conj mildly injected, right not injected, no sinus tenderness, TM's and canals clear  Neck: supple, no obvious TM, no anterior or  posterior cervical lymphadenopathy. Heart: Regular rate and rhythmwithout murmurs rubs or gallops.  Lungs: Clearto auscultation Neuro:Patient moves on and off of exam table and in room without difficulty. Gait is normal   Assessment: Biometric screen 1. Encounter for other general examination-brief biometric exam with biometric screening-this is not a full annual physical.  2. Encounter for biometric screening  3. Increased BMI/obesity  4. Recommended continued follow-up with a primary  care provider and her gyn provider for the medical problems/issues noted above   Plan:  glucose and lipids(noted not fasting prior to lab draw today) Discussed with patient that today's visit here is a limited biometric screening visit (not a comprehensive exam or management of any chronic problems) Discussed some health issues, including healthy eating habits and importance ofexercise as doing more recent past. Encouraged to follow-up with PCP for annual comprehensive preventive and wellness care, andforfollow-upand managementof chronic medical problems or issues.Also continued follow up with her eye doctor and breast cancer doctor recommended. Alsoencouraged toshare lab results with her PCP as well. Questions invited and answered.   I will have the office call you on your glucose and cholesterol results when they return (if cannot be sent to your Mychart account). If you have notbeen contactedwithin 1 week please call the office. Please call the clinic during office hours with any questions or concerns.  This biometric physical is a brief physical and the only labs done are glucose and your lipid panel(cholesterol) and isnot a substitute for seeing a primary care provider for a complete annual physical. Please see a primary care physician for routine health maintenance, labs and full physical at least yearly and follow up as recommended by your provider. Provider also recommends if you do not have a primary care provider for patient to establish care as soon as possible .Patient may chose provider of choice. Also, the [facility] at [phone] or web site at [email or web address] can help assist with finding a primary care doctor.  Patient verbalizes understanding that her office visit is acute care only and not a substitute for a primary care visit or for the management of chronic conditions.

## 2019-10-07 LAB — LIPID PANEL
Chol/HDL Ratio: 2.7 ratio (ref 0.0–4.4)
Cholesterol, Total: 178 mg/dL (ref 100–199)
HDL: 65 mg/dL (ref >39)
LDL Chol Calc (NIH): 99 mg/dL (ref 0–99)
Triglycerides: 76 mg/dL (ref 0–149)
VLDL Cholesterol Cal: 14 mg/dL (ref 5–40)

## 2019-10-07 LAB — GLUCOSE, RANDOM: Glucose: 102 mg/dL — ABNORMAL HIGH (ref 65–99)

## 2019-10-11 ENCOUNTER — Ambulatory Visit
Admit: 2019-10-11 | Discharge: 2019-10-12 | Payer: PRIVATE HEALTH INSURANCE | Attending: Dermatology | Primary: Dermatology

## 2019-10-11 DIAGNOSIS — L732 Hidradenitis suppurativa: Principal | ICD-10-CM

## 2019-10-11 MED ORDER — DOXYCYCLINE HYCLATE 100 MG TABLET
ORAL_TABLET | Freq: Two times a day (BID) | ORAL | 3 refills | 30.00000 days | Status: CP
Start: 2019-10-11 — End: ?

## 2019-10-11 MED ORDER — BENZOYL PEROXIDE 2.5 % TOPICAL GEL
Freq: Every day | TOPICAL | 2 refills | 0.00000 days | Status: CP
Start: 2019-10-11 — End: 2020-10-10

## 2019-10-11 MED ORDER — CLINDAMYCIN 1 % LOTION
Freq: Two times a day (BID) | TOPICAL | 2 refills | 0.00000 days | Status: CP
Start: 2019-10-11 — End: 2020-10-10

## 2019-10-19 ENCOUNTER — Telehealth
Admit: 2019-10-19 | Discharge: 2019-10-20 | Payer: PRIVATE HEALTH INSURANCE | Attending: Hematology & Oncology | Primary: Hematology & Oncology

## 2019-11-07 DIAGNOSIS — E1122 Type 2 diabetes mellitus with diabetic chronic kidney disease: Principal | ICD-10-CM

## 2019-11-16 ENCOUNTER — Ambulatory Visit: Admit: 2019-11-16 | Discharge: 2019-11-17 | Payer: PRIVATE HEALTH INSURANCE

## 2019-11-16 DIAGNOSIS — E1122 Type 2 diabetes mellitus with diabetic chronic kidney disease: Principal | ICD-10-CM

## 2019-11-16 DIAGNOSIS — N182 Chronic kidney disease, stage 2 (mild): Principal | ICD-10-CM

## 2019-11-16 DIAGNOSIS — L732 Hidradenitis suppurativa: Principal | ICD-10-CM

## 2019-11-16 MED ORDER — METFORMIN 1,000 MG TABLET
ORAL_TABLET | Freq: Two times a day (BID) | ORAL | 3 refills | 90.00000 days | Status: CP
Start: 2019-11-16 — End: 2020-11-15

## 2019-11-16 MED ORDER — ATORVASTATIN 40 MG TABLET
ORAL_TABLET | Freq: Every day | ORAL | 3 refills | 90.00000 days | Status: CP
Start: 2019-11-16 — End: 2020-11-15

## 2019-11-25 ENCOUNTER — Telehealth: Admit: 2019-11-25 | Discharge: 2019-11-26 | Payer: PRIVATE HEALTH INSURANCE

## 2019-11-25 DIAGNOSIS — N939 Abnormal uterine and vaginal bleeding, unspecified: Principal | ICD-10-CM

## 2019-11-25 DIAGNOSIS — M2550 Pain in unspecified joint: Principal | ICD-10-CM

## 2019-11-25 DIAGNOSIS — H209 Unspecified iridocyclitis: Principal | ICD-10-CM

## 2019-11-25 DIAGNOSIS — Z79899 Other long term (current) drug therapy: Principal | ICD-10-CM

## 2019-11-25 MED ORDER — METHOTREXATE SODIUM 2.5 MG TABLET
ORAL_TABLET | ORAL | 3 refills | 28.00000 days | Status: CP
Start: 2019-11-25 — End: 2019-12-25

## 2019-11-25 MED ORDER — FOLIC ACID 1 MG TABLET
ORAL_TABLET | Freq: Every day | ORAL | 11 refills | 30.00000 days | Status: CP
Start: 2019-11-25 — End: 2020-11-24

## 2019-12-14 ENCOUNTER — Ambulatory Visit
Admit: 2019-12-14 | Discharge: 2019-12-15 | Payer: PRIVATE HEALTH INSURANCE | Attending: Obstetrics & Gynecology | Primary: Obstetrics & Gynecology

## 2019-12-14 DIAGNOSIS — N939 Abnormal uterine and vaginal bleeding, unspecified: Principal | ICD-10-CM

## 2019-12-17 DIAGNOSIS — H209 Unspecified iridocyclitis: Principal | ICD-10-CM

## 2019-12-17 MED ORDER — METHOTREXATE SODIUM 2.5 MG TABLET
ORAL_TABLET | 1 refills | 0 days | Status: CP
Start: 2019-12-17 — End: ?

## 2019-12-28 ENCOUNTER — Telehealth
Admit: 2019-12-28 | Discharge: 2019-12-29 | Payer: PRIVATE HEALTH INSURANCE | Attending: Nutritionist | Primary: Nutritionist

## 2019-12-28 DIAGNOSIS — E1122 Type 2 diabetes mellitus with diabetic chronic kidney disease: Principal | ICD-10-CM

## 2020-01-06 ENCOUNTER — Institutional Professional Consult (permissible substitution): Admit: 2020-01-06 | Discharge: 2020-01-07 | Payer: PRIVATE HEALTH INSURANCE

## 2020-01-07 ENCOUNTER — Ambulatory Visit: Admit: 2020-01-07 | Discharge: 2020-01-08 | Payer: PRIVATE HEALTH INSURANCE | Attending: Family | Primary: Family

## 2020-01-07 ENCOUNTER — Ambulatory Visit: Admit: 2020-01-07 | Discharge: 2020-01-08 | Payer: PRIVATE HEALTH INSURANCE

## 2020-01-10 ENCOUNTER — Telehealth: Admit: 2020-01-10 | Discharge: 2020-01-11 | Payer: PRIVATE HEALTH INSURANCE

## 2020-01-10 DIAGNOSIS — U071 COVID-19: Principal | ICD-10-CM

## 2020-01-10 DIAGNOSIS — J988 Other specified respiratory disorders: Principal | ICD-10-CM

## 2020-01-11 ENCOUNTER — Ambulatory Visit: Admit: 2020-01-11 | Discharge: 2020-01-12 | Payer: PRIVATE HEALTH INSURANCE

## 2020-01-11 DIAGNOSIS — J988 Other specified respiratory disorders: Secondary | ICD-10-CM

## 2020-01-11 DIAGNOSIS — U071 COVID-19: Principal | ICD-10-CM

## 2020-01-14 ENCOUNTER — Telehealth: Admit: 2020-01-14 | Discharge: 2020-01-15 | Payer: PRIVATE HEALTH INSURANCE

## 2020-01-14 DIAGNOSIS — U071 COVID-19: Principal | ICD-10-CM

## 2020-01-14 DIAGNOSIS — J988 Other specified respiratory disorders: Principal | ICD-10-CM

## 2020-01-27 ENCOUNTER — Ambulatory Visit: Admit: 2020-01-27 | Discharge: 2020-01-28 | Payer: PRIVATE HEALTH INSURANCE

## 2020-02-01 ENCOUNTER — Institutional Professional Consult (permissible substitution)
Admit: 2020-02-01 | Discharge: 2020-02-02 | Payer: PRIVATE HEALTH INSURANCE | Attending: Nutritionist | Primary: Nutritionist

## 2020-02-02 ENCOUNTER — Ambulatory Visit
Admit: 2020-02-02 | Discharge: 2020-02-06 | Payer: PRIVATE HEALTH INSURANCE | Attending: Radiation Oncology | Primary: Radiation Oncology

## 2020-02-02 ENCOUNTER — Ambulatory Visit: Admit: 2020-02-02 | Discharge: 2020-02-03 | Payer: PRIVATE HEALTH INSURANCE

## 2020-02-02 DIAGNOSIS — J988 Other specified respiratory disorders: Secondary | ICD-10-CM

## 2020-02-02 DIAGNOSIS — Z171 Estrogen receptor negative status [ER-]: Principal | ICD-10-CM

## 2020-02-02 DIAGNOSIS — N182 Chronic kidney disease, stage 2 (mild): Principal | ICD-10-CM

## 2020-02-02 DIAGNOSIS — E1122 Type 2 diabetes mellitus with diabetic chronic kidney disease: Principal | ICD-10-CM

## 2020-02-02 DIAGNOSIS — C50311 Malignant neoplasm of lower-inner quadrant of right female breast: Secondary | ICD-10-CM

## 2020-02-02 DIAGNOSIS — U071 COVID-19: Secondary | ICD-10-CM

## 2020-02-02 DIAGNOSIS — M2550 Pain in unspecified joint: Principal | ICD-10-CM

## 2020-02-02 DIAGNOSIS — Z79899 Other long term (current) drug therapy: Principal | ICD-10-CM

## 2020-02-10 ENCOUNTER — Telehealth
Admit: 2020-02-10 | Discharge: 2020-02-11 | Payer: PRIVATE HEALTH INSURANCE | Attending: Obstetrics & Gynecology | Primary: Obstetrics & Gynecology

## 2020-02-24 ENCOUNTER — Ambulatory Visit
Admit: 2020-02-24 | Discharge: 2020-02-25 | Payer: PRIVATE HEALTH INSURANCE | Attending: Women's Health | Primary: Women's Health

## 2020-02-24 DIAGNOSIS — N950 Postmenopausal bleeding: Principal | ICD-10-CM

## 2020-03-20 ENCOUNTER — Telehealth
Admit: 2020-03-20 | Discharge: 2020-03-21 | Payer: PRIVATE HEALTH INSURANCE | Attending: Obstetrics & Gynecology | Primary: Obstetrics & Gynecology

## 2020-03-20 DIAGNOSIS — N950 Postmenopausal bleeding: Principal | ICD-10-CM

## 2020-04-20 ENCOUNTER — Ambulatory Visit: Admit: 2020-04-20 | Discharge: 2020-04-21 | Payer: PRIVATE HEALTH INSURANCE

## 2020-05-02 ENCOUNTER — Ambulatory Visit
Admit: 2020-05-02 | Discharge: 2020-05-02 | Payer: PRIVATE HEALTH INSURANCE | Attending: Hematology & Oncology | Primary: Hematology & Oncology

## 2020-05-02 ENCOUNTER — Ambulatory Visit: Admit: 2020-05-02 | Discharge: 2020-05-02 | Payer: PRIVATE HEALTH INSURANCE

## 2020-05-02 DIAGNOSIS — Z171 Estrogen receptor negative status [ER-]: Secondary | ICD-10-CM

## 2020-05-02 DIAGNOSIS — E1122 Type 2 diabetes mellitus with diabetic chronic kidney disease: Secondary | ICD-10-CM

## 2020-05-02 DIAGNOSIS — C50311 Malignant neoplasm of lower-inner quadrant of right female breast: Principal | ICD-10-CM

## 2020-05-02 DIAGNOSIS — H209 Unspecified iridocyclitis: Principal | ICD-10-CM

## 2020-05-02 DIAGNOSIS — N950 Postmenopausal bleeding: Principal | ICD-10-CM

## 2020-05-02 DIAGNOSIS — N182 Chronic kidney disease, stage 2 (mild): Secondary | ICD-10-CM

## 2020-05-11 ENCOUNTER — Ambulatory Visit: Admit: 2020-05-11 | Discharge: 2020-05-12 | Payer: PRIVATE HEALTH INSURANCE

## 2020-05-28 DIAGNOSIS — H209 Unspecified iridocyclitis: Principal | ICD-10-CM

## 2020-05-29 MED ORDER — METHOTREXATE SODIUM 2.5 MG TABLET
ORAL_TABLET | 0 refills | 0 days | Status: CP
Start: 2020-05-29 — End: ?

## 2020-05-31 ENCOUNTER — Ambulatory Visit
Admit: 2020-05-31 | Discharge: 2020-06-01 | Payer: PRIVATE HEALTH INSURANCE | Attending: Dermatology | Primary: Dermatology

## 2020-05-31 DIAGNOSIS — L732 Hidradenitis suppurativa: Principal | ICD-10-CM

## 2020-05-31 DIAGNOSIS — L918 Other hypertrophic disorders of the skin: Principal | ICD-10-CM

## 2020-05-31 MED ORDER — CLINDAMYCIN 1 % LOTION
Freq: Two times a day (BID) | TOPICAL | 5 refills | 0.00000 days | Status: CP
Start: 2020-05-31 — End: 2021-05-31

## 2020-05-31 MED ORDER — BENZOYL PEROXIDE 2.5 % TOPICAL GEL
Freq: Every day | TOPICAL | 5 refills | 0.00000 days | Status: CP
Start: 2020-05-31 — End: 2021-05-31

## 2020-05-31 MED ORDER — DOXYCYCLINE HYCLATE 100 MG TABLET
ORAL_TABLET | Freq: Two times a day (BID) | ORAL | 3 refills | 30.00000 days | Status: CP
Start: 2020-05-31 — End: ?

## 2020-05-31 MED ORDER — TRIAMCINOLONE ACETONIDE 0.1 % TOPICAL OINTMENT
Freq: Two times a day (BID) | TOPICAL | 2 refills | 0.00000 days | Status: CP
Start: 2020-05-31 — End: 2021-05-31

## 2020-08-09 ENCOUNTER — Ambulatory Visit
Admit: 2020-08-09 | Discharge: 2020-08-10 | Payer: PRIVATE HEALTH INSURANCE | Attending: Radiation Oncology | Primary: Radiation Oncology

## 2020-08-11 ENCOUNTER — Ambulatory Visit
Admit: 2020-08-11 | Discharge: 2020-08-12 | Payer: PRIVATE HEALTH INSURANCE | Attending: Dermatology | Primary: Dermatology

## 2020-08-11 DIAGNOSIS — L732 Hidradenitis suppurativa: Principal | ICD-10-CM

## 2020-08-13 DIAGNOSIS — H209 Unspecified iridocyclitis: Principal | ICD-10-CM

## 2020-08-16 MED ORDER — METHOTREXATE SODIUM 2.5 MG TABLET
ORAL_TABLET | 0 refills | 0 days | Status: CP
Start: 2020-08-16 — End: ?

## 2020-08-28 ENCOUNTER — Telehealth: Admit: 2020-08-28 | Payer: PRIVATE HEALTH INSURANCE | Attending: Dermatology | Primary: Dermatology

## 2020-08-28 DIAGNOSIS — Z01812 Encounter for preprocedural laboratory examination: Principal | ICD-10-CM

## 2020-08-29 ENCOUNTER — Ambulatory Visit: Admit: 2020-08-29 | Discharge: 2020-08-30 | Payer: PRIVATE HEALTH INSURANCE

## 2020-09-11 DIAGNOSIS — H209 Unspecified iridocyclitis: Principal | ICD-10-CM

## 2020-09-11 MED ORDER — METHOTREXATE SODIUM 2.5 MG TABLET
ORAL_TABLET | 0 refills | 0 days | Status: CP
Start: 2020-09-11 — End: ?

## 2020-09-18 ENCOUNTER — Ambulatory Visit: Admit: 2020-09-18 | Discharge: 2020-09-19 | Payer: PRIVATE HEALTH INSURANCE

## 2020-09-18 DIAGNOSIS — H209 Unspecified iridocyclitis: Principal | ICD-10-CM

## 2020-10-23 ENCOUNTER — Ambulatory Visit
Admit: 2020-10-23 | Discharge: 2020-10-23 | Payer: PRIVATE HEALTH INSURANCE | Attending: Dermatology | Primary: Dermatology

## 2020-10-23 ENCOUNTER — Ambulatory Visit: Admit: 2020-10-23 | Discharge: 2020-10-23 | Payer: PRIVATE HEALTH INSURANCE

## 2020-10-23 DIAGNOSIS — N182 Chronic kidney disease, stage 2 (mild): Principal | ICD-10-CM

## 2020-10-23 DIAGNOSIS — L732 Hidradenitis suppurativa: Principal | ICD-10-CM

## 2020-10-23 DIAGNOSIS — H209 Unspecified iridocyclitis: Principal | ICD-10-CM

## 2020-10-23 DIAGNOSIS — L820 Inflamed seborrheic keratosis: Principal | ICD-10-CM

## 2020-10-23 DIAGNOSIS — E1122 Type 2 diabetes mellitus with diabetic chronic kidney disease: Principal | ICD-10-CM

## 2020-10-23 MED ORDER — EMPAGLIFLOZIN 10 MG TABLET
ORAL_TABLET | Freq: Every day | ORAL | 3 refills | 90 days | Status: CP
Start: 2020-10-23 — End: ?

## 2020-10-31 ENCOUNTER — Ambulatory Visit
Admit: 2020-10-31 | Discharge: 2020-11-01 | Payer: PRIVATE HEALTH INSURANCE | Attending: Hematology & Oncology | Primary: Hematology & Oncology

## 2020-10-31 DIAGNOSIS — Z171 Estrogen receptor negative status [ER-]: Principal | ICD-10-CM

## 2020-10-31 DIAGNOSIS — C50311 Malignant neoplasm of lower-inner quadrant of right female breast: Principal | ICD-10-CM

## 2020-10-31 MED ORDER — ESCITALOPRAM 10 MG TABLET
ORAL_TABLET | Freq: Every day | ORAL | 11 refills | 30 days | Status: CP
Start: 2020-10-31 — End: 2021-10-31

## 2020-11-12 DIAGNOSIS — H209 Unspecified iridocyclitis: Principal | ICD-10-CM

## 2020-11-15 MED ORDER — FOLIC ACID 1 MG TABLET
ORAL_TABLET | Freq: Every day | ORAL | 3 refills | 90.00000 days | Status: CP
Start: 2020-11-15 — End: 2020-11-15

## 2020-11-15 MED ORDER — FOLIC ACID 1 MG TABLET: 1000 ug | tablet | Freq: Every day | 3 refills | 90 days | Status: AC

## 2020-11-16 DIAGNOSIS — E1122 Type 2 diabetes mellitus with diabetic chronic kidney disease: Principal | ICD-10-CM

## 2020-11-16 MED ORDER — ATORVASTATIN 40 MG TABLET
ORAL_TABLET | 3 refills | 0 days | Status: CP
Start: 2020-11-16 — End: ?

## 2020-11-16 MED ORDER — METFORMIN 1,000 MG TABLET
ORAL_TABLET | 3 refills | 0 days | Status: CP
Start: 2020-11-16 — End: ?

## 2020-12-14 DIAGNOSIS — H209 Unspecified iridocyclitis: Principal | ICD-10-CM

## 2020-12-15 MED ORDER — METHOTREXATE SODIUM 2.5 MG TABLET
ORAL_TABLET | 0 refills | 0 days | Status: CP
Start: 2020-12-15 — End: 2020-12-22

## 2020-12-22 ENCOUNTER — Telehealth: Admit: 2020-12-22 | Discharge: 2020-12-23 | Payer: PRIVATE HEALTH INSURANCE

## 2020-12-22 DIAGNOSIS — H209 Unspecified iridocyclitis: Principal | ICD-10-CM

## 2020-12-22 DIAGNOSIS — Z79899 Other long term (current) drug therapy: Principal | ICD-10-CM

## 2020-12-22 MED ORDER — METHOTREXATE SODIUM 2.5 MG TABLET
ORAL_TABLET | ORAL | 3 refills | 84 days | Status: CP
Start: 2020-12-22 — End: ?

## 2021-01-02 ENCOUNTER — Ambulatory Visit: Admit: 2021-01-02 | Discharge: 2021-01-03 | Payer: PRIVATE HEALTH INSURANCE

## 2021-01-02 DIAGNOSIS — E1122 Type 2 diabetes mellitus with diabetic chronic kidney disease: Principal | ICD-10-CM

## 2021-01-02 DIAGNOSIS — N182 Chronic kidney disease, stage 2 (mild): Principal | ICD-10-CM

## 2021-01-02 DIAGNOSIS — M940 Chondrocostal junction syndrome [Tietze]: Principal | ICD-10-CM

## 2021-01-02 DIAGNOSIS — Z79899 Other long term (current) drug therapy: Principal | ICD-10-CM

## 2021-01-02 DIAGNOSIS — H209 Unspecified iridocyclitis: Principal | ICD-10-CM

## 2021-01-02 MED ORDER — DICLOFENAC 1 % TOPICAL GEL
Freq: Four times a day (QID) | TOPICAL | 1 refills | 13 days | Status: CP
Start: 2021-01-02 — End: 2022-01-02

## 2021-01-12 ENCOUNTER — Ambulatory Visit: Admit: 2021-01-12 | Discharge: 2021-01-13 | Payer: PRIVATE HEALTH INSURANCE

## 2021-01-12 DIAGNOSIS — H209 Unspecified iridocyclitis: Principal | ICD-10-CM

## 2021-04-06 ENCOUNTER — Ambulatory Visit: Admit: 2021-04-06 | Discharge: 2021-04-07 | Payer: PRIVATE HEALTH INSURANCE

## 2021-04-06 DIAGNOSIS — M940 Chondrocostal junction syndrome [Tietze]: Principal | ICD-10-CM

## 2021-04-06 DIAGNOSIS — E1122 Type 2 diabetes mellitus with diabetic chronic kidney disease: Principal | ICD-10-CM

## 2021-04-06 DIAGNOSIS — N182 Chronic kidney disease, stage 2 (mild): Principal | ICD-10-CM

## 2021-04-06 DIAGNOSIS — L732 Hidradenitis suppurativa: Principal | ICD-10-CM

## 2021-04-06 DIAGNOSIS — H209 Unspecified iridocyclitis: Principal | ICD-10-CM

## 2021-04-06 MED ORDER — DOXYCYCLINE HYCLATE 100 MG TABLET
ORAL_TABLET | Freq: Two times a day (BID) | ORAL | 0 refills | 90 days | Status: CP
Start: 2021-04-06 — End: ?

## 2021-04-06 MED ORDER — EMPAGLIFLOZIN 25 MG TABLET
ORAL_TABLET | Freq: Every day | ORAL | 3 refills | 90 days | Status: CP
Start: 2021-04-06 — End: ?

## 2021-04-13 ENCOUNTER — Ambulatory Visit: Admit: 2021-04-13 | Discharge: 2021-04-14 | Payer: PRIVATE HEALTH INSURANCE

## 2021-04-17 ENCOUNTER — Ambulatory Visit: Admit: 2021-04-17 | Discharge: 2021-04-17 | Payer: PRIVATE HEALTH INSURANCE

## 2021-04-17 ENCOUNTER — Ambulatory Visit
Admit: 2021-04-17 | Discharge: 2021-04-17 | Payer: PRIVATE HEALTH INSURANCE | Attending: Hematology & Oncology | Primary: Hematology & Oncology

## 2021-04-17 DIAGNOSIS — Z171 Estrogen receptor negative status [ER-]: Principal | ICD-10-CM

## 2021-04-17 DIAGNOSIS — C50311 Malignant neoplasm of lower-inner quadrant of right female breast: Principal | ICD-10-CM

## 2021-04-24 ENCOUNTER — Ambulatory Visit: Admit: 2021-04-24 | Discharge: 2021-04-24 | Payer: PRIVATE HEALTH INSURANCE

## 2021-04-24 DIAGNOSIS — Z79899 Other long term (current) drug therapy: Principal | ICD-10-CM

## 2021-04-24 DIAGNOSIS — M2550 Pain in unspecified joint: Principal | ICD-10-CM

## 2021-04-24 DIAGNOSIS — H209 Unspecified iridocyclitis: Principal | ICD-10-CM

## 2021-05-02 ENCOUNTER — Ambulatory Visit
Admit: 2021-05-02 | Discharge: 2021-05-08 | Payer: PRIVATE HEALTH INSURANCE | Attending: Radiation Oncology | Primary: Radiation Oncology

## 2021-05-14 ENCOUNTER — Ambulatory Visit: Admit: 2021-05-14 | Discharge: 2021-05-15 | Payer: PRIVATE HEALTH INSURANCE

## 2021-05-14 DIAGNOSIS — L732 Hidradenitis suppurativa: Principal | ICD-10-CM

## 2021-05-14 DIAGNOSIS — R21 Rash and other nonspecific skin eruption: Principal | ICD-10-CM

## 2021-05-14 MED ORDER — TACROLIMUS 0.1 % TOPICAL OINTMENT
Freq: Two times a day (BID) | TOPICAL | 4 refills | 100.00000 days | Status: CP
Start: 2021-05-14 — End: 2022-05-14

## 2021-05-14 MED ORDER — DOXYCYCLINE HYCLATE 100 MG TABLET
ORAL_TABLET | Freq: Two times a day (BID) | ORAL | 0 refills | 90.00000 days | Status: CP
Start: 2021-05-14 — End: ?

## 2021-05-14 MED ORDER — FLUCONAZOLE 200 MG TABLET
ORAL_TABLET | 0 refills | 0.00000 days | Status: CP
Start: 2021-05-14 — End: ?

## 2021-08-18 ENCOUNTER — Ambulatory Visit (INDEPENDENT_AMBULATORY_CARE_PROVIDER_SITE_OTHER): Payer: Managed Care, Other (non HMO)

## 2021-08-18 ENCOUNTER — Ambulatory Visit
Admission: EM | Admit: 2021-08-18 | Discharge: 2021-08-18 | Disposition: A | Discharge status: Home / Self Care | Payer: Managed Care, Other (non HMO) | Attending: Emergency Medicine | Admitting: Emergency Medicine

## 2021-08-18 ENCOUNTER — Encounter: Payer: Self-pay | Admitting: Emergency Medicine

## 2021-08-18 ENCOUNTER — Other Ambulatory Visit: Payer: Self-pay

## 2021-08-18 DIAGNOSIS — M25521 Pain in right elbow: Secondary | ICD-10-CM

## 2021-08-18 MED ORDER — PREDNISONE 10 MG (21) PO TBPK: ORAL_TABLET | ORAL | 0 refills | Status: DC

## 2021-08-18 NOTE — Discharge Instructions (Addendum)
Your x-rays were negative for the presence of a broken bone.  But this does not rule out soft tissue injury such as bruising to your nerve which can be causing both the pain and the weakness in her grip.  I have referred you to [facility] clinic orthopedics for further evaluation.  Take the prednisone starting tomorrow morning with breakfast according to the package instructions.  I am prescribing this to help with presumptive nerve inflammation and the prednisone will decrease the inflammation of the nerve and should help improve your symptoms.  Return for reevaluation for new or worsening symptoms.

## 2021-08-18 NOTE — ED Provider Notes (Signed)
[facility]    CSN: [id number] Arrival date & time: 08/18/21  [id number]      History   Chief Complaint Chief Complaint  Patient presents with   Arm Pain    Right      HPI PASTY LIPFORD is a 55 y.o. female.   HPI  64 old female here for evaluation of right elbow pain.  Patient reports that she has been experiencing pain in her right elbow for the past month.  She states that she was getting ready to leave on vacation and she struck her elbow on the corner of a countertop.  She states that she had to drive to their destination and she did not notice much discomfort or swelling initially.  The next day she noticed that her elbow was swollen and painful.  She has been treating it with ice which does help with swelling and pain and she is also been using a compression sleeve which is helping her symptoms as well.  She states initially she had pain going down her arm into her hand and yesterday she developed pain going up the back of her arm to her shoulder.  She is also complaining of decreased grip strength in her right hand and she is right-hand dominant.  She is not having any numbness or tingling in her fingers and no alterations of sensation.  Patient has full range of motion of her elbow.  Past Medical History:  Diagnosis Date   Breast cancer ([place])    stage 1   Dysplasia of cervix    HPV (human papilloma virus) infection    Iron deficiency anemia    Knee pain    Left   Leiomyoma of uterus    Menstrual migraine 03/04/2017   Migraine    Obesity    Uveitis    left    Patient Active Problem List   Diagnosis Date Noted   Anemia 07/23/2017   Nausea 04/14/2017   ASCUS of cervix with negative high risk HPV 03/14/2017   Hx of transient ischemic attack (TIA) 03/14/2017   Malignant neoplasm of lower-inner quadrant of right breast of female, estrogen receptor negative ([place]) 03/14/2017   Pre-diabetes 03/14/2017   Menstrual migraine 03/04/2017   Obesity 01/05/2015    Iron deficiency anemia 03/26/2013    Past Surgical History:  Procedure Laterality Date   FOOT SURGERY Bilateral 2013   HALLUX VALGUS CORRECTION      OB History   No obstetric history on file.      Home Medications    Prior to Admission medications   Medication Sig Start Date End Date Taking? Authorizing Provider  predniSONE (STERAPRED UNI-PAK 21 TAB) 10 MG (21) TBPK tablet Take 6 tablets on day 1, 5 tablets day 2, 4 tablets day 3, 3 tablets day 4, 2 tablets day 5, 1 tablet day 6 08/18/21  Yes Margarette Canada, NP  ferrous gluconate (FERGON) 324 MG tablet Take 1 tablet (324 mg total) by mouth daily with breakfast. 03/04/17   Kathrynn Ducking, MD  GAVILYTE-G 236 g solution  10/23/18   [provider]  mupirocin ointment (BACTROBAN) 2 % Place into the nose. 07/19/17   [provider]  naproxen (NAPROSYN) 500 MG tablet Take 1 tablet (500 mg total) by mouth 2 (two) times daily with a meal. 08/25/18   Letitia Neri L, PA-C  rizatriptan (MAXALT-MLT) 10 MG disintegrating tablet Take 1 tablet (10 mg total) by mouth 3 (three) times daily as needed for  migraine. May repeat in 2 hours if needed 07/09/18   Kathrynn Ducking, MD  topiramate (TOPAMAX) 25 MG tablet Take one tablet at night for one week, then take 2 tablets at night for one week, then take 3 tablets at night. 07/09/18   Kathrynn Ducking, MD    Family History Family History  Problem Relation Age of Onset   Diabetes Mother    Heart disease Mother    Hypertension Mother    COPD Mother    Cancer Mother    Diabetes Sister    Diabetes Maternal Grandmother    Heart disease Maternal Grandmother    Hypertension Maternal Grandmother    Cancer Maternal Grandmother     Social History Social History   Tobacco Use   Smoking status: Never   Smokeless tobacco: Never  Vaping Use   Vaping Use: Never used  Substance Use Topics   Alcohol use: No   Drug use: No     Allergies   Metaclazepam and Sumatriptan  succinate   Review of Systems Review of Systems  Constitutional:  Negative for activity change, appetite change and fever.  Musculoskeletal:  Positive for arthralgias and joint swelling.  Skin:  Negative for color change.  Neurological:  Positive for weakness. Negative for numbness.  Hematological: Negative.   Psychiatric/Behavioral: Negative.      Physical Exam Triage Vital Signs ED Triage Vitals  Enc Vitals Group     BP 08/18/21 0837 116/78     Pulse Rate 08/18/21 0837 75     Resp 08/18/21 0837 18     Temp 08/18/21 0837 98.5 F (36.9 C)     Temp src --      SpO2 08/18/21 0837 100 %     Weight --      Height --      Head Circumference --      Peak Flow --      Pain Score 08/18/21 0835 10     Pain Loc --      Pain Edu? --      Excl. in [place]? --    No data found.  Updated Vital Signs BP 116/78   Pulse 75   Temp 98.5 F (36.9 C)   Resp 18   LMP 07/13/2018   SpO2 100%   Visual Acuity Right Eye Distance:   Left Eye Distance:   Bilateral Distance:    Right Eye Near:   Left Eye Near:    Bilateral Near:     Physical Exam Vitals and nursing note reviewed.  Constitutional:      General: She is not in acute distress.    Appearance: Normal appearance. She is not ill-appearing.  HENT:     Head: Normocephalic and atraumatic.  Musculoskeletal:        General: Tenderness present. No swelling or deformity. Normal range of motion.  Skin:    General: Skin is warm and dry.     Capillary Refill: Capillary refill takes less than 2 seconds.     Findings: No bruising, erythema or rash.  Neurological:     General: No focal deficit present.     Mental Status: She is alert and oriented to person, place, and time.     Sensory: No sensory deficit.     Motor: Weakness present.  Psychiatric:        Mood and Affect: Mood normal.        Behavior: Behavior normal.        Thought  Content: Thought content normal.        Judgment: Judgment normal.     UC Treatments / Results   Labs (all labs ordered are listed, but only abnormal results are displayed) Labs Reviewed - No data to display  EKG   Radiology DG Elbow Complete Right  Result Date: 08/18/2021 CLINICAL DATA:  Pain for 1 month after trauma. EXAM: RIGHT ELBOW - COMPLETE 3+ VIEW COMPARISON:  None. FINDINGS: No acute fracture or dislocation.  No joint effusion. IMPRESSION: No acute osseous abnormality. Electronically Signed   By: Abigail Miyamoto M.D.   On: 08/18/2021 10:08    Procedures Procedures (including critical care time)  Medications Ordered in UC Medications - No data to display  Initial Impression / Assessment and Plan / UC Course  I have reviewed the triage vital signs and the nursing notes.  Pertinent labs & imaging results that were available during my care of the patient were reviewed by me and considered in my medical decision making (see chart for details).  Patient is a nontoxic-appearing 55 year old female here for evaluation of 1 months [place] of right elbow pain with associated radiation of pain up and down her arm and decreased grip strength in the right hand.  Initial injury was striking the olecranon process of her elbow on a corner of a counter.  Patient's physical exam reveals a right elbow this in normal anatomical alignment.  Patient does have tenderness with palpation to the medial lateral epicondyle of the humerus as well as palpation of the olecranon process.  There is also tenderness in the olecranon groove which patient Clearence Cheek is actually where she struck the corner of the countertop.  Patient has decreased grip in the right hand of 3/5 versus 5/5 in the left hand.  Patient does have full sensation in her fingers and she has good finger strength.  Patient has full range of motion of her elbow as well passively and actively.  Under passive range of motion when I get her to full extension there is a clicking in the elbow joint.  Pain and decreased grip strength to look for bony  abnormality.  The plan either way is to have patient continue her therapy and refer her to orthopedics for further evaluation.  If she has a fracture we will place patient in a sling.  Right elbow x-rays independently reviewed and evaluated by me.  Impression: There is no evidence of fracture or dislocation noted on the x-ray.  Radiology overread is pending. Radiology interpretation is that there is no acute osseous abnormality.  Will have patient follow-up with orthopedics as she may have injured her nerve.  We will place patient on steroid to decrease nerve inflammation and see if that helps improve her symptoms.  Are made a referral to Dr. Rudene Christians who is on for unassigned call for [facility] clinic Ortho.  Final Clinical Impressions(s) / UC Diagnoses   Final diagnoses:  Right elbow pain     Discharge Instructions      Your x-rays were negative for the presence of a broken bone.  But this does not rule out soft tissue injury such as bruising to your nerve which can be causing both the pain and the weakness in her grip.  I have referred you to [facility] clinic orthopedics for further evaluation.  Take the prednisone starting tomorrow morning with breakfast according to the package instructions.  I am prescribing this to help with presumptive nerve inflammation and the prednisone will decrease the inflammation  of the nerve and should help improve your symptoms.  Return for reevaluation for new or worsening symptoms.     ED Prescriptions     Medication Sig Dispense Auth. Provider   predniSONE (STERAPRED UNI-PAK 21 TAB) 10 MG (21) TBPK tablet Take 6 tablets on day 1, 5 tablets day 2, 4 tablets day 3, 3 tablets day 4, 2 tablets day 5, 1 tablet day 6 21 tablet Margarette Canada, NP      PDMP not reviewed this encounter.   Margarette Canada, NP 08/18/21 1032

## 2021-08-18 NOTE — ED Triage Notes (Signed)
Pt is present today with right elbow pain. Pt states that she hit her elbow last month on a counter top and has had pain and discomfort. Pt states that she has also noticed slight swelling

## 2021-08-28 ENCOUNTER — Ambulatory Visit
Admit: 2021-08-28 | Discharge: 2021-08-29 | Payer: PRIVATE HEALTH INSURANCE | Attending: Student in an Organized Health Care Education/Training Program | Primary: Student in an Organized Health Care Education/Training Program

## 2021-08-28 NOTE — Unmapped (Signed)
LATONA KRICHBAUM is a 55 y.o. patient presenting to urgent care clinic for evaluation of acute onset of right eye pain after getting a block thrown at her right eye.         Past Occular History  Anterior Uveitis, left eye follows with Dr. Letta Kocher     Past Medical History  Past Medical History:   Diagnosis Date   ??? Abnormal mammogram    ??? Anemia    ??? Anterior uveitis     Left eye   ??? Breast cancer in female (CMS-HCC) 03/14/2017    right breast    ??? Diabetes mellitus (CMS-HCC)    ??? Prediabetes         Assessment and Plan  #Conjunctivitis   - pt states that she is having clear discharge, watery diarrhea, and sinus congestion that all started shortly after getting the block in her eye.   - no signs of trauma on exam just conj injection that blanches with phenyl   - no cell on exam      Plan:  - artificial tears and cold compresses prn for symptomatic relief  - There is not currently indication for antibiotic therapy  - discussed the importance of strict hand hygiene and not sharing linens or towels for containment of viral infections  - RTC PRN       Return precautions discussed       Patient Discussed/Seen with Dr. Talmage Nap, MD   [facility] Ophthalmology Department, PGY-2

## 2021-09-03 ENCOUNTER — Ambulatory Visit: Admit: 2021-09-03 | Discharge: 2021-09-04 | Payer: PRIVATE HEALTH INSURANCE

## 2021-09-03 MED ORDER — TRULICITY 0.75 MG/0.5 ML SUBCUTANEOUS PEN INJECTOR
SUBCUTANEOUS | 1 refills | 28 days | Status: CP
Start: 2021-09-03 — End: 2021-09-03

## 2021-09-03 MED ORDER — LIRAGLUTIDE 0.6 MG/0.1 ML (18 MG/3 ML) SUBCUTANEOUS PEN INJECTOR
2 refills | 0 days | Status: CP
Start: 2021-09-03 — End: ?

## 2021-09-18 ENCOUNTER — Ambulatory Visit: Admit: 2021-09-18 | Discharge: 2021-09-19 | Payer: PRIVATE HEALTH INSURANCE

## 2021-09-18 DIAGNOSIS — Z79899 Other long term (current) drug therapy: Principal | ICD-10-CM

## 2021-09-18 DIAGNOSIS — M47819 Spondylosis without myelopathy or radiculopathy, site unspecified: Principal | ICD-10-CM

## 2021-09-18 DIAGNOSIS — H209 Unspecified iridocyclitis: Principal | ICD-10-CM

## 2021-09-18 DIAGNOSIS — Z7185 Immunization counseling: Principal | ICD-10-CM

## 2021-09-18 DIAGNOSIS — M2550 Pain in unspecified joint: Principal | ICD-10-CM

## 2021-09-18 MED ORDER — FOLIC ACID 1 MG TABLET
ORAL_TABLET | Freq: Every day | ORAL | 3 refills | 90.00000 days | Status: CP
Start: 2021-09-18 — End: ?

## 2021-09-18 MED ORDER — METHOTREXATE (PF) 20 MG/0.4 ML SUBCUTANEOUS AUTO-INJECTOR
SUBCUTANEOUS | 3 refills | 0 days | Status: CP
Start: 2021-09-18 — End: ?

## 2021-09-19 DIAGNOSIS — Z79899 Other long term (current) drug therapy: Principal | ICD-10-CM

## 2021-09-25 MED ORDER — PEN NEEDLE, DIABETIC 31 GAUGE X 5/16" (8 MM)
11 refills | 0 days | Status: CP
Start: 2021-09-25 — End: 2022-09-25

## 2021-09-27 DIAGNOSIS — H209 Unspecified iridocyclitis: Principal | ICD-10-CM

## 2021-09-27 MED ORDER — RASUVO (PF) 20 MG/0.4 ML SUBCUTANEOUS AUTO-INJECTOR
SUBCUTANEOUS | 3 refills | 0 days | Status: CP
Start: 2021-09-27 — End: ?
  Filled 2021-10-30: qty 4.8, 84d supply, fill #0

## 2021-10-02 MED ORDER — EMPTY CONTAINER
2 refills | 0 days
Start: 2021-10-02 — End: ?

## 2021-10-02 NOTE — Unmapped (Signed)
[facility] Specialty Medication Onboarding    Specialty Medication: Rasuvo 20mg /0.63ml pen  Prior Authorization: Not Required   Financial Assistance: Yes - copay card approved as secondary   Final Copay/Day Supply: $0 / 84 days    Insurance Restrictions: None     Notes to Pharmacist:     The triage team has completed the benefits investigation and has determined that the patient is able to fill this medication at [facility]. Please contact the patient to complete the onboarding or follow up with the prescribing physician as needed.

## 2021-10-02 NOTE — Unmapped (Signed)
The [facility] has made a fourth and final attempt to reach this patient to refill the following medication: Rasuvo.      We have left voicemails on the following phone numbers: [phone], have been unable to leave messages on the following phone numbers: [phone] (rings out, not VM option) and have sent a MyChart message.    Dates contacted: 10/25, 10/27, 11/1, 11/7, 11/11  Last scheduled delivery: never filled     The patient may be at risk of non-compliance with this medication. The patient should call the [facility] at [phone]  Option 4, then Option 2 (all other specialty patients) to refill medication.    Julianne Rice   [facility] Specialty Pharmacist

## 2021-10-03 NOTE — Unmapped (Signed)
Form Processing Request - Route to Prov&Clerk - ROUTE TO YOUR TEAM CLERK  Type of Form: Physical Form for Medical Insurance   Form left by: Patient during 09/03/21 visit with PCP.   Forms placed in clerk box/provider box/other: N/A  Patient prefers pick-up/mail/other: Pick Up  Address/fax/etc. : N/A  Date needed: before 10/08/21  Best time to call: Any     Patient advised that Dr. Katrinka Blazing would complete and leave up front for pick up and she has not heard anything since visit.      ROUTE TO YOUR TEAM CLERK

## 2021-10-03 NOTE — Unmapped (Signed)
This form was completed and scanned under the media tab 09/06/2021 which means it is likely at the front desk.  Please check and let the patient know.  Thanks!

## 2021-10-09 NOTE — Unmapped (Signed)
Called and LVM with patient. Form was scanned in media tab by [place] on 9/29. If she still needs a copy can print off.

## 2021-10-16 ENCOUNTER — Ambulatory Visit: Admit: 2021-10-16 | Discharge: 2021-10-17 | Payer: PRIVATE HEALTH INSURANCE | Attending: Family | Primary: Family

## 2021-10-16 DIAGNOSIS — Z171 Estrogen receptor negative status [ER-]: Principal | ICD-10-CM

## 2021-10-16 DIAGNOSIS — C50311 Malignant neoplasm of lower-inner quadrant of right female breast: Principal | ICD-10-CM

## 2021-10-16 NOTE — Unmapped (Signed)
Patient Name: Penny Gibbs  Patient Age: 55 y.o.  Encounter Date: 10/16/2021     Referring Physician: Amada Kingfisher, Md  [address]  [id number]  [facility] [place],  [place] [address].     PCP: Amada Kingfisher, MD    Surgical oncology Dr. Dellis Anes  Medical oncology Dr. Garlon Hatchet  Radiation oncology Dr. Chales Abrahams    Reason for Visit: A 55 y.o. female with  breast cancer for reassessment and further management right-sided ER and PR neg/HER2 positive  breast cancer (diagnosed in  April 2018, received [facility]). Currently NED.     Assessment: See detailed oncology history below.     Plan: The following issues were discussed:  - Goals of therapy: Curative.  - Current status: no evidence of metastases or recurrence  - Breast imaging: Bilateral mammography [facility] 04/17/21 BI-RADS 2 and suggest repeat in 1 year.  - Night sweats: minimal on today's visit  - [place] syndrome: Genetic disease on her father's side, he was one of 13 siblings all 13 deceased from [place]. Previously scheduled for genetic counseling and testing, missed appointment and is currently ambivalent about getting tested, but may pursue it in future. Currently with no symptoms. She has 1 sister who is also asymptomatic and has not been tested.    - Rheumatoid arthritis: Has uveitis and managed at ophthalmology on [facility] and currently on weekly low dose methotrexate.  No major visual loss: recommend that she have liver function tests done periodically given protracted use of low dose methotrexate.   - Exercise and weight: Managed by PCP.  - Follow-up: Follow up in visit in 12 months with [facility], encourage follow-up with Rad/Onc+MMG 04/2022    Interim History:   -Doing well overall.  -No symptoms suggesting recurrent breast cancer.   -Less than 1 hot flash a day.  -Mild fatigue  -Occasional skin itching.  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  Living with sister.  States occasionally walks about 30 minutes a day and working on weight control but without great success.    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 Still lives with sister (who had stroke in her 18's last year as well as COVID). Mother died in 02/17/21of congestive heart failure. Excellent quality of life currently.     -Performance status 0.  .Personal and Social History: Patient is single and lives with her mother and her sister. She is a Merchandiser, retail of social services, wears mask. Marland Kitchen No children. No pets. Returned  to work September 6.2019     Oncology History Overview Note   Right breast cancer       History of breast cancer   02/25/2017 -  Presenting Symptoms    Screening detected B/L MMG:  FINDINGS: 2 new less than 2 cm oval circumscribed dense masses in the posterior depth LIQ  right breast. Other stable subcentimeter asymmetries are present in both breasts. No architectural distortion or malignant calcifications in either breast.     02/25/2017 Interval Scan(s)    Diagnostic Right MMG/US:  MMG: Right breast ILQ 1.1 cm adjacent masses do not efface with compression views.   ??  [place]: At the 4:00 position 10 cm from the nipple, bi-lobed parallel mass/or 2 adjacent masses with some indistinct margins, hypoechoic echotexture, and enhanced through transmission measuring 2.3 x 1.3 x 1.2  cm. ??Evaluation of the right axilla demonstrated multiple lymph nodes with prominent cortices that appear similar compared to prior mammograms dating back to 2012.     03/11/2017 Biopsy    Right breast CNBx w/clip placement:   A. 4 o'clock, 10 cm from nipple, core needle biopsy  - Invasive ductal carcinoma   - [place] combined histologic grade: 3  - ER Negative, PR Negative, Her2/neu+ (+3) per [facility]     03/19/2017 Tumor Board    R cT2 N0 IDC, G3, -/-/+. Stable bilateral adenopathy present on imaging. Excellent BCT candidate with or without NACT. Discussed surg 1st approach to help direct medical therapy. Meeting surgery, med/onc, rad/onc today.     03/21/2017 Surgery    Right partial mastectomy and sentinel node biopsy by Dr. Dellis Anes: Multifocal right sided breast cancer. Largest lesion 23 mm and 2 smaller lesions one 1 mm, and a second 0.2 mm. One sentinel lymph node negative. T2 N0 M0     04/09/2017 - 07/23/2017 Chemotherapy    Started TCHP echocardiogram WNL.      04/18/2017 Adverse Reaction    [place] ER visit=sob, abd pain, diarrhea, hematochezia and UTI.  CXR showed port migration to azygos vein--new port placed 04/29/17.     --C2D1 04/30/17-only given TCH--Perjeta held.  ADDENDUM only received Perjeta for C1--held for the remainder of cycles d/t diarrhea and ER visits.       05/31/2017 Adverse Reaction    [facility] [place] visit-persistent N/V dehydration with AKI Cr=154, required 1L IVF     07/23/2017 Adverse Reaction    Hgb=7.6 required 2 units PRBCs     08/18/2017 - 10/02/2017 Radiation      Treatment site Treatment Technique/  Modality Energy Dose per fraction Total number  of fractions Total dose Start date End date   Right whole breast Tangents with Field in [place] Mixed 6 and 15 MV/MeV 200 cGy 25 5000 cGy 08/18/2017  ?? 09/25/2017   Right breast boost [place] Electrons 15 MV/MeV 200 cGy 5 1000 cGy 09/26/2017 10/02/2017          11/25/2017 -  Immunotherapy     On-going herceptin-complete by 04/2018          Past Medical History:   Diagnosis Date   ??? Abnormal mammogram    ??? Anemia    ??? Anterior uveitis     Left eye   ??? Breast cancer in female (CMS-HCC) 03/14/2017    right breast    ??? Diabetes mellitus (CMS-HCC)    ??? Prediabetes          Current Outpatient Medications:   ???  atorvastatin (LIPITOR) 40 MG tablet, TAKE 1 TABLET BY MOUTH EVERY DAY, Disp: 90 tablet, Rfl: 3  ???  diclofenac sodium (VOLTAREN) 1 % gel, Apply 2 g topically four (4) times a day., Disp: 100 g, Rfl: 1  ???  doxycycline (VIBRA-TABS) 100 MG tablet, Take 1 tablet (100 mg total) by mouth Two (2) times a day. With food, Disp: 180 tablet, Rfl: 0  ???  empagliflozin (JARDIANCE) 25 mg tablet, Take 1 tablet (25 mg total) by mouth daily., Disp: 90 tablet, Rfl: 3  ???  empty container Misc, USE AS DIRECTED, Disp: 1 each, Rfl: 2  ???  folic acid (FOLVITE) 1 MG tablet, Take 1 tablet (1,000 mcg total) by mouth daily., Disp: 90 tablet, Rfl: 3  ???  liraglutide (VICTOZA) injection pen, Inject 0.6 mg once daily for 1 week then increase to 1.2 mg once daily, Disp: 3  mL, Rfl: 2  ???  metFORMIN (GLUCOPHAGE) 1000 MG tablet, TAKE 1 TABLET BY MOUTH TWICE A DAY WITH MEALS, Disp: 180 tablet, Rfl: 3  ???  methotrexate 2.5 MG tablet, Take 8 tablets (20 mg total) by mouth once a week., Disp: 96 tablet, Rfl: 3  ???  methotrexate, PF, (RASUVO, PF,) 20 mg/0.4 mL AtIn, Inject the contents of 1 pen (20 mg) under the skin every seven (7) days., Disp: 12 mL, Rfl: 3  ???  pen needle, diabetic (PEN NEEDLE) 31 gauge x 5/16 (8 mm) Ndle, Injection Frequency is 1 time per day; Dx Code: Type 2 Diabetes uncontrolled (E11.65), Disp: 100 each, Rfl: 11  ???  tacrolimus (PROTOPIC) 0.1 % ointment, Apply 1 application topically Two (2) times a day. To back as needed, Disp: 100 g, Rfl: 4    Review of Systems: A complete review of systems was obtained including: Constitutional, Eyes, ENT, Cardiovascular, Respiratory, GI, GU, Musculoskeletal, Skin, Neurological, Psychiatric, Endocrine, Heme/Lymphatic, and Allergic/Immunologic systems. It is negative or non-contributory to the patient???s management except for the following: None    Physical Examination:  Vital Signs: BP 107/61  - Pulse 101  - Temp 37 ??C (98.6 ??F) (Temporal)  - Resp 20  - Ht 162.6 cm (5' 4)  - Wt (!) 109.7 kg (241 lb 14.4 oz)  - SpO2 100%  - BMI 41.52 kg/m??   Wt Readings from Last 6 Encounters:   10/16/21 (!) 109.7 kg (241 lb 14.4 oz)   09/18/21 (!) 110.8 kg (244 lb 3.2 oz)   09/03/21 (!) 111.6 kg (246 lb)   05/02/21 (!) 114.2 kg (251 lb 12.8 oz)   04/24/21 (!) 113.2 kg (249 lb 9.6 oz)   04/17/21 (!) 113.4 kg (249 lb 14.4 oz)   General: Well appearing woman in no acute distress. Healthy-appearing female in no acute distress..  Cardiovascular: S1 S2 with no murmurs or gallops appreciated, heart sounds somewhat muffled from habitus.  Respiratory: Lung fields clear bilaterally at bases and throughout.   Gastrointestinal:  Abdomen round, soft, non-tender with no appreciable masses or hepatosplenomegaly.   Musculoskeletal:  No bony pain or tenderness.   Psychiatric:  Alert and oriented. Affect appropriate, and interactive. .   Heme/Lymphatic/Immunologic:  No cervical or axillary adenopathy.   Upper Extremity Lymphedema: None.   Breast and regional nodes: Well healed lumpectomy on right breast with sentinel node biopsy scars, no masses, edema or evidence of recurrence.  Left breast exam normal.

## 2021-10-29 NOTE — Unmapped (Signed)
[facility] RHEUMATOLOGY CLINIC - PHARMACIST NOTES    [facility] unable to reach patient to start Rasuvo shipment.   Spoke with Ms. Chretien over the phone - she thought medication was denied so did not think to call the pharmacy back. Informed patient that Otrexup was denied but Rasuvo is approved and copay is $0/month.  Will connect patient to [facility] to schedule med shipment. Injection training and med counseling provided to patient.    Regimen & Administration: Rasuvo 20mg  subq once weekly.    Administer without regards to meal.      Storage/Handling/Disposal:  Room Temperature.  Patient will dispose of needles in a sharps container or empty laundry detergent bottle.      Drug-Drug & Drug-Food Interactions:  None noted    Side-effects:    - Patient tolerated oral methotrexate okay with some diarrhea. Expect Rasuvo will minimize GI side effects.  Patient declined further side effect review.   ?? Counseled on signs of a significant drug reaction (wheezing, chest tightness, fever, itching, cough, blue skin color, seizures, swelling of face, lips, tongue or throat, etc)    Injection Training:   ?? Reviewed injection techniques and patient felt comfortable with self-administration at home.  Injection site-reactions discussed.    Emphasized the importance of adherence to prescribed regimen, clinic follow-up visits, and laboratory testing.      All questions were answered and contact information provided for any future questions/concerns.      Jeneen Montgomery

## 2021-10-29 NOTE — Unmapped (Signed)
[facility]   Patient Onboarding/Medication Counseling    Switching from pills to injections    Penny Gibbs is a 55 y.o. female with Uveitis who I am counseling today on initiation of therapy.  I am speaking to the patient.    Was a Nurse, learning disability used for this call? No    Verified patient's date of birth / HIPAA.    Specialty medication(s) to be sent: Inflammatory Disorders: Rasuvo      Non-specialty medications/supplies to be sent: sharps container      Medications not needed at this time: n/a         Rasuvo (methotrexate injection)    The patient declined counseling on medication administration, missed dose instructions, goals of therapy, side effects and monitoring parameters, warnings and precautions, drug/food interactions and storage, handling precautions, and disposal because they were counseled in clinic (see note from [place]). The information in the declined sections below are for informational purposes only and was not discussed with patient.     Medication & Administration     Dosage: Inject the contents of one auto-injector (20mg ) under the skin every 7 days    Administration:     Auto-injector pen  1. Gather all supplies needed for injection on a clean, flat working surface: medication pen, alcohol swabs, sharps container, etc.  2. Look at the medication label - look for correct medication, correct dose, and check the expiration date  3. Look at the medication - the liquid in the viewing window of the pen device should appear clear and yellow in color  4. Select injection site - you can use the front of your thigh or your belly (but not the area 2 inches around your belly button)  5. Prepare injection site - wash your hands and clean the skin at the injection site with an alcohol swab and let it air dry, do not touch the injection site again before the injection  6. Remove the yellow safety cap by pulling it straight off, do not attempt to twist it; the pen is now ready to use  7. Pinch up a pad of skin surrounding the cleaned injection site with your thumb and forefinger of your hand not holding the auto-injector pen  8. Position the uncapped transparent end of the auto-injector against your cleaned injection site at a 90 degree angle; push the pen firmly onto your skin until you feel the stop point in order to unlock the yellow injection button  9. While holding the pen firmly in place, depress the yellow injection button with your thumb to initiate the injection process; you will hear a click when the process begins; continue to hold the pen firmly in place for at least 5 seconds; visualize the transparent control zone of the pen to make sure the entire dose is injection  10. When the movement stopped the injection is completed and you can pull the pen away from your injection site  11. Dispose of the used auto-injector immediately in your sharps disposal container  12. If you see any blood at the injection site, press a cotton ball or gauze on the site and maintain pressure until the bleeding stops, do not rub the injection site      Adherence/Missed dose instructions:  If your injection is given more than 2 days after your scheduled injection date - consult your pharmacist for additional instructions on how to adjust your dosing schedule.    Goals of Therapy  Polyarticular juvenile idiopathic arthritis  ??? Assuring normal growth and development  ??? Avoidance of long-term systemic glucocorticoid use  ??? Protection of remaining articular structures  ??? Maintenance of function  ??? Maintenance of effective psychosocial functioning    Systemic juvenile idiopathic arthritis  ??? Assuring normal growth and development  ??? Avoidance of long-term systemic glucocorticoid use  ??? Protection of remaining articular structures  ??? Maintenance of function  ??? Maintenance of effective psychosocial functioning    Rheumatoid arthritis  ??? Achieve symptom remission  ??? Slow disease progression  ??? Protection of remaining articular structures  ??? Maintenance of function  ??? Maintenance of effective psychosocial functioning    Side Effects & Monitoring Parameters     ??? Upset stomach, diarrhea, nausea or throwing up  ??? Feeling dizzy, tired, or weak  ??? Hair thinning (reversible)  ??? Minor cold-like symptoms  ??? Photosensitivity - use sunscreen and avoid prolonged exposure    The following side effects should be reported to the provider:  ??? Signs of a hypersensitivity reaction - rash; hives; itching; red, swollen, blistered, or peeling skin; wheezing; tightness in the chest or throat; difficulty breathing, swallowing, or talking; swelling of the mouth, face, lips, tongue, or throat; etc.  ??? Reduced immune function - report signs of infection such as fever; chills; body aches; very bad sore throat; ear or sinus pain; cough; more sputum or change in color of sputum; pain with passing urine; wound that will not heal, etc.  Also at a slightly higher risk of some malignancies (mainly skin and blood cancers) due to this reduced immune function.  o In the case of signs of infection - the patient should hold the next dose of Rasuvo?? and call your primary care provider to ensure adequate medical care.  Treatment may be resumed when infection is treated and patient is asymptomatic.  ??? Signs of bleeding - throwing up or coughing up blood; blood in the urine; black, red, or tarry stool; abnormal vaginal bleeding; bruises without a cause or that get bigger  ??? Signs of pancreatitis - sudden very bad stomach pain, unexplained vomiting  ??? Signs of kidney dysfunction - unable to pass urine; blood in the urine; sudden weight gain  ??? Signs of liver dysfunction - darkened urine; upset stomach; light-colored stool; yellow skin or eyes  ??? Signs of nerve problems - burning; numbness; tingling  ??? Pinpoint red spots on the skin  ??? Changes in eyesight      Contraindications, Warnings, & Precautions     ??? Have your bloodwork checked as you have been told by your prescriber  ??? Talk with your doctor if you are pregnant, planning to become pregnant, or breastfeeding - women taking this medication must use birth control while taking this drug and for some time after the last dose  ??? Discuss the possible need for holding your dose(s) of methotrexate when a planned procedure is scheduled with the prescriber as it may delay healing/recovery timeline       Drug/Food Interactions     ??? Medication list reviewed in Epic. The patient was instructed to inform the care team before taking any new medications or supplements. No drug interactions identified.     Storage, Handling Precautions, & Disposal     ??? Store intact pens at room temperature  ??? Protect from light  ??? Dispose of used pens in a sharps disposal container            Current Medications (including  OTC/herbals), Comorbidities and Allergies     Current Outpatient Medications   Medication Sig Dispense Refill   ??? atorvastatin (LIPITOR) 40 MG tablet TAKE 1 TABLET BY MOUTH EVERY DAY 90 tablet 3   ??? diclofenac sodium (VOLTAREN) 1 % gel Apply 2 g topically four (4) times a day. 100 g 1   ??? doxycycline (VIBRA-TABS) 100 MG tablet Take 1 tablet (100 mg total) by mouth Two (2) times a day. With food 180 tablet 0   ??? empagliflozin (JARDIANCE) 25 mg tablet Take 1 tablet (25 mg total) by mouth daily. 90 tablet 3   ??? empty container Misc USE AS DIRECTED 1 each 2   ??? folic acid (FOLVITE) 1 MG tablet Take 1 tablet (1,000 mcg total) by mouth daily. 90 tablet 3   ??? liraglutide (VICTOZA) injection pen Inject 0.6 mg once daily for 1 week then increase to 1.2 mg once daily 3 mL 2   ??? metFORMIN (GLUCOPHAGE) 1000 MG tablet TAKE 1 TABLET BY MOUTH TWICE A DAY WITH MEALS 180 tablet 3   ??? methotrexate 2.5 MG tablet Take 8 tablets (20 mg total) by mouth once a week. 96 tablet 3   ??? methotrexate, PF, (RASUVO, PF,) 20 mg/0.4 mL AtIn Inject the contents of 1 pen (20 mg) under the skin every seven (7) days. 12 mL 3   ??? pen needle, diabetic (PEN NEEDLE) 31 gauge x 5/16 (8 mm) Ndle Injection Frequency is 1 time per day; Dx Code: Type 2 Diabetes uncontrolled (E11.65) 100 each 11   ??? tacrolimus (PROTOPIC) 0.1 % ointment Apply 1 application topically Two (2) times a day. To back as needed 100 g 4     No current facility-administered medications for this visit.       Allergies   Allergen Reactions   ??? Metaclazepam Hcl Hives   ??? Reglan [Metoclopramide Hcl] Shortness Of Breath     States it makes her feel like her throat is closing up   ??? Sumatriptan Succinate      Other reaction(s): SWELLING/EDEMA  Other reaction(s): SWELLING/EDEMA       Patient Active Problem List   Diagnosis   ??? Iron deficiency anemia   ??? Dysplasia of cervix uteri   ??? Leiomyoma of uterus   ??? Type 2 diabetes mellitus (CMS-HCC)   ??? Hx of transient ischemic attack (TIA)   ??? History of breast cancer   ??? Menstrual migraine   ??? Eczema   ??? Uveitis   ??? CKD stage 2 due to type 2 diabetes mellitus (CMS-HCC)   ??? Hidradenitis suppurativa   ??? Morbid (severe) obesity due to excess calories (CMS-HCC)   ??? COVID-19   ??? Postmenopausal bleeding   ??? Costochondritis   ??? Spondyloarthritis       Reviewed and up to date in Epic.    Appropriateness of Therapy     Acute infections noted within Epic:  No active infections  Patient reported infection: None    Is medication and dose appropriate based on diagnosis and infection status? Yes    Prescription has been clinically reviewed: Yes      Baseline Quality of Life Assessment      How many days over the past month did your Uveitis  keep you from your normal activities? For example, brushing your teeth or getting up in the morning. 0    Financial Information     Medication Assistance provided: Prior Authorization and Copay Assistance    Anticipated copay of $0  reviewed with patient. Verified delivery address.    Delivery Information     Scheduled delivery date: 11/23    Expected start date: 11/23    Medication will be delivered via Next Day Courier to the prescription address in [facility].  This shipment will not require a signature.      Explained the services we provide at [facility] and that each month we would call to set up refills.  Stressed importance of returning phone calls so that we could ensure they receive their medications in time each month.  Informed patient that we should be setting up refills 7-10 days prior to when they will run out of medication.  A pharmacist will reach out to perform a clinical assessment periodically.  Informed patient that a welcome packet, containing information about our pharmacy and other support services, a Notice of Privacy Practices, and a drug information handout will be sent.      The patient or caregiver noted above participated in the development of this care plan and knows that they can request review of or adjustments to the care plan at any time.      Patient or caregiver verbalized understanding of the above information as well as how to contact the pharmacy at [phone] option 4 with any questions/concerns.  The pharmacy is open Monday through Friday 8:30am-4:30pm.  A pharmacist is available 24/7 via pager to answer any clinical questions they may have.    Patient Specific Needs     - Does the patient have any physical, cognitive, or cultural barriers? No    - Does the patient have adequate living arrangements? (i.e. the ability to store and take their medication appropriately) Yes    - Did you identify any home environmental safety or security hazards? No    - Patient prefers to have medications discussed with  Patient     - Is the patient or caregiver able to read and understand education materials at a high school level or above? Yes    - Patient's primary language is  English     - Is the patient high risk? No    - Does the patient require physician intervention or other additional services (i.e. dietary/nutrition, smoking cessation, social work)? No      Julianne Rice  [facility] Specialty Pharmacist

## 2021-10-30 MED FILL — EMPTY CONTAINER: 90 days supply | Qty: 1 | Fill #0

## 2021-11-11 NOTE — Unmapped (Addendum)
For eczema:  - Continue applying tacrolimus ointment two times a day to the back as needed  - Continue taking fluconazole 200 MG tablets once weekly  - For your back you can try a product with benzoyl peroxide (Panoxyl)    For hidradenitis suppurative:  - Continue taking doxycycline 100 MG tablet once daily with food  - Continue applying clindamycin twice daily

## 2021-11-11 NOTE — Unmapped (Unsigned)
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 To back as needed  - Continue fluconazole (DIFLUCAN) 200 MG tablet; Take 1 tablet by mouth weekly     Hidradenitis suppurativa, Hurley II- stable, not assessed clinically today per pt preference  - Chronic disease; Primarily affecting groin > axillae  - currently on doxycycline and doing well; recommend continue clindamycin lotion and doxycycline  - s/p LHR, doing well   - Continue doxycycline (VIBRA-TABS) 100 MG tablet; Take 1 tablet (100 mg total) by mouth Two (2) times a day. With food    There are no diagnoses linked to this encounter.    The patient was advised to call for an appointment should any new, changing, or symptomatic lesions develop.     RTC: No follow-ups on file. or sooner as needed   _________________________________________________________________    Chief Complaint     Follow up of ***    HPI     Penny R. Padgett is a 55 y.o. female who presents as a returning patient (last seen by Dr. Caralyn Guile and Dr. Charlsie Merles on 05/14/2021) to [facility] for follow up of ***. At last visit, patient was to start tacrolimus 0.1% ointment and fluconazole 200 MG tablet once weekly for eczema. Additionally, she was to start doxycycline 100 MG tablets twice daily for hidradenitis suppurativa.    Today patient reports ***    The patient denies any other new or changing lesions or areas of concern.     Pertinent Past Medical History     No history of skin cancer    Family History:   Negative for melanoma    Past Medical History, Family History, Social History, Medication List, Allergies, and Problem List were reviewed in the rooming section of Epic.     ROS: Other than symptoms mentioned in the HPI, no fevers, chills, or other skin complaints    Physical Examination     GENERAL: Well-appearing female in no acute distress, resting comfortably.  NEURO: Alert and oriented, answers questions appropriately  PSYCH: Normal mood and affect  {PE extent:75514}  {PE list:75421}  ***    All areas not commented on are within normal limits or unremarkable    Scribe's Attestation: Harl Favor, MD obtained and performed the history, physical exam and medical decision making elements that were entered into the chart.  Signed by Mindi Junker, Scribe, on  ***    {*** NOTE TO PROVIDER: PLEASE ADD ATTESTATION NOTING YOU AGREE WITH SCRIBE DOCUMENTATION}     (Approved Template 08/21/2020)

## 2021-11-12 ENCOUNTER — Ambulatory Visit: Admit: 2021-11-12 | Discharge: 2021-11-13 | Payer: PRIVATE HEALTH INSURANCE

## 2021-11-12 DIAGNOSIS — L732 Hidradenitis suppurativa: Principal | ICD-10-CM

## 2021-11-12 MED ORDER — DOXYCYCLINE HYCLATE 100 MG TABLET
ORAL_TABLET | Freq: Two times a day (BID) | ORAL | 1 refills | 90.00000 days | Status: CP
Start: 2021-11-12 — End: ?

## 2021-11-12 MED ORDER — CLINDAMYCIN 1 % LOTION
Freq: Two times a day (BID) | TOPICAL | 11 refills | 0.00000 days | Status: CP
Start: 2021-11-12 — End: 2022-11-12

## 2021-12-03 DIAGNOSIS — E1122 Type 2 diabetes mellitus with diabetic chronic kidney disease: Principal | ICD-10-CM

## 2021-12-03 MED ORDER — METFORMIN 1,000 MG TABLET
ORAL_TABLET | 3 refills | 0 days
Start: 2021-12-03 — End: ?

## 2021-12-04 MED ORDER — METFORMIN 1,000 MG TABLET
ORAL_TABLET | 1 refills | 0 days | Status: CP
Start: 2021-12-04 — End: ?

## 2021-12-17 ENCOUNTER — Emergency Department: Payer: Managed Care, Other (non HMO)

## 2021-12-17 ENCOUNTER — Emergency Department
Admission: EM | Admit: 2021-12-17 | Discharge: 2021-12-17 | Discharge status: Against Medical Advice | Payer: Managed Care, Other (non HMO) | Attending: Emergency Medicine | Admitting: Emergency Medicine

## 2021-12-17 ENCOUNTER — Other Ambulatory Visit: Payer: Self-pay

## 2021-12-17 ENCOUNTER — Encounter: Payer: Self-pay | Admitting: Radiology

## 2021-12-17 DIAGNOSIS — Z5321 Procedure and treatment not carried out due to patient leaving prior to being seen by health care provider: Secondary | ICD-10-CM | POA: Insufficient documentation

## 2021-12-17 DIAGNOSIS — R079 Chest pain, unspecified: Secondary | ICD-10-CM | POA: Diagnosis present

## 2021-12-17 DIAGNOSIS — M549 Dorsalgia, unspecified: Secondary | ICD-10-CM | POA: Diagnosis not present

## 2021-12-17 LAB — CBC
HCT: 40.1 % (ref 36.0–46.0)
Hemoglobin: 13.2 g/dL (ref 12.0–15.0)
MCH: 28.1 pg (ref 26.0–34.0)
MCHC: 32.9 g/dL (ref 30.0–36.0)
MCV: 85.3 fL (ref 80.0–100.0)
Platelets: 264 10*3/uL (ref 150–400)
RBC: 4.7 MIL/uL (ref 3.87–5.11)
RDW: 14.8 % (ref 11.5–15.5)
WBC: 7.9 10*3/uL (ref 4.0–10.5)
nRBC: 0 % (ref 0.0–0.2)

## 2021-12-17 LAB — BASIC METABOLIC PANEL
Anion gap: 8 (ref 5–15)
BUN: 17 mg/dL (ref 6–20)
CO2: 27 mmol/L (ref 22–32)
Calcium: 9.9 mg/dL (ref 8.9–10.3)
Chloride: 104 mmol/L (ref 98–111)
Creatinine, Ser: 1.16 mg/dL — ABNORMAL HIGH (ref 0.44–1.00)
GFR, Estimated: 56 mL/min — ABNORMAL LOW (ref >60)
Glucose, Bld: 115 mg/dL — ABNORMAL HIGH (ref 70–99)
Potassium: 3.8 mmol/L (ref 3.5–5.1)
Sodium: 139 mmol/L (ref 135–145)

## 2021-12-17 LAB — TROPONIN I (HIGH SENSITIVITY): Troponin I (High Sensitivity): 3 ng/L (ref <18)

## 2021-12-17 NOTE — ED Triage Notes (Signed)
Pt presents via POV sent from [facility] c/o mid back pain radiating into her chest between 2-3pm today. Reports nausea associated with pain. Sent to ED for further evaluation.

## 2021-12-18 ENCOUNTER — Emergency Department
Admit: 2021-12-18 | Discharge: 2021-12-18 | Disposition: A | Discharge status: Home / Self Care | Payer: PRIVATE HEALTH INSURANCE | Attending: Emergency Medicine

## 2021-12-18 ENCOUNTER — Ambulatory Visit
Admit: 2021-12-18 | Discharge: 2021-12-18 | Disposition: A | Discharge status: Home / Self Care | Payer: PRIVATE HEALTH INSURANCE | Attending: Emergency Medicine

## 2021-12-18 LAB — CBC W/ AUTO DIFF
BASOPHILS ABSOLUTE COUNT: 0 10*9/L (ref 0.0–0.1)
BASOPHILS RELATIVE PERCENT: 0.6 %
EOSINOPHILS ABSOLUTE COUNT: 0 10*9/L (ref 0.0–0.5)
EOSINOPHILS RELATIVE PERCENT: 0.5 %
HEMATOCRIT: 39.4 % (ref 34.0–44.0)
HEMOGLOBIN: 13.3 g/dL (ref 11.3–14.9)
LYMPHOCYTES ABSOLUTE COUNT: 1.6 10*9/L (ref 1.1–3.6)
LYMPHOCYTES RELATIVE PERCENT: 26.4 %
MEAN CORPUSCULAR HEMOGLOBIN CONC: 33.7 g/dL (ref 32.0–36.0)
MEAN CORPUSCULAR HEMOGLOBIN: 28 pg (ref 25.9–32.4)
MEAN CORPUSCULAR VOLUME: 83.2 fL (ref 77.6–95.7)
MEAN PLATELET VOLUME: 9.6 fL (ref 6.8–10.7)
MONOCYTES ABSOLUTE COUNT: 0.4 10*9/L (ref 0.3–0.8)
MONOCYTES RELATIVE PERCENT: 7.4 %
NEUTROPHILS ABSOLUTE COUNT: 3.9 10*9/L (ref 1.8–7.8)
NEUTROPHILS RELATIVE PERCENT: 65.1 %
NUCLEATED RED BLOOD CELLS: 0 /100{WBCs} (ref <=4)
PLATELET COUNT: 258 10*9/L (ref 150–450)
RED BLOOD CELL COUNT: 4.74 10*12/L (ref 3.95–5.13)
RED CELL DISTRIBUTION WIDTH: 16.4 % — ABNORMAL HIGH (ref 12.2–15.2)
WBC ADJUSTED: 6 10*9/L (ref 3.6–11.2)

## 2021-12-18 LAB — COMPREHENSIVE METABOLIC PANEL
ALBUMIN: 4.4 g/dL (ref 3.4–5.0)
ALKALINE PHOSPHATASE: 69 U/L (ref 46–116)
ALT (SGPT): 15 U/L (ref 10–49)
ANION GAP: 10 mmol/L (ref 5–14)
AST (SGOT): 17 U/L (ref <=34)
BILIRUBIN TOTAL: 0.8 mg/dL (ref 0.3–1.2)
BLOOD UREA NITROGEN: 15 mg/dL (ref 9–23)
BUN / CREAT RATIO: 13
CALCIUM: 10.2 mg/dL (ref 8.7–10.4)
CHLORIDE: 107 mmol/L (ref 98–107)
CO2: 24.4 mmol/L (ref 20.0–31.0)
CREATININE: 1.12 mg/dL — ABNORMAL HIGH
EGFR CKD-EPI (2021) FEMALE: 59 mL/min/{1.73_m2} — ABNORMAL LOW (ref >=60)
GLUCOSE RANDOM: 123 mg/dL (ref 70–179)
POTASSIUM: 3.9 mmol/L (ref 3.4–4.8)
PROTEIN TOTAL: 7.6 g/dL (ref 5.7–8.2)
SODIUM: 141 mmol/L (ref 135–145)

## 2021-12-18 LAB — HIGH SENSITIVITY TROPONIN I - SINGLE: HIGH SENSITIVITY TROPONIN I: <3 ng/L (ref <=34)

## 2021-12-18 LAB — LIPASE: LIPASE: 66 U/L — ABNORMAL HIGH (ref 12–53)

## 2021-12-18 MED ADMIN — lactated ringers bolus 1,000 mL: 1000 mL | INTRAVENOUS | @ 09:00:00 | Stop: 2021-12-18

## 2021-12-18 MED ADMIN — aluminum-magnesium hydroxide-simethicone (MAALOX MAX) 80-80-8 mg/mL oral suspension: 30 mL | ORAL | @ 08:00:00 | Stop: 2021-12-18

## 2021-12-18 MED ADMIN — ondansetron (ZOFRAN-ODT) disintegrating tablet 4 mg: 4 mg | ORAL | @ 08:00:00 | Stop: 2021-12-18

## 2021-12-18 MED ADMIN — sucralfate (CARAFATE) oral suspension: 1 g | ORAL | @ 08:00:00 | Stop: 2021-12-18

## 2021-12-18 MED ADMIN — prochlorperazine (COMPAZINE) injection 5 mg: 5 mg | INTRAVENOUS | @ 11:00:00 | Stop: 2021-12-18

## 2021-12-18 MED ADMIN — iohexoL (OMNIPAQUE) 350 mg iodine/mL solution 100 mL: 100 mL | INTRAVENOUS | @ 11:00:00 | Stop: 2021-12-18

## 2021-12-18 MED ADMIN — lidocaine (XYLOCAINE) 2% viscous mucosal solution: 10 mL | ORAL | @ 08:00:00 | Stop: 2021-12-18

## 2021-12-18 NOTE — Unmapped (Signed)
Pt given discharge paperwork and all questions answered at this time. Leaving in NAD.

## 2021-12-18 NOTE — Unmapped (Signed)
[facility]  Emergency Department Provider Note     ED Clinical Impression     Final diagnoses:   Chest pain, unspecified type (Primary)   Back pain, unspecified back location, unspecified back pain laterality, unspecified chronicity      Impression, Medical Decision Making, ED Course     Impression: 56 y.o. female who has a past medical history of Abnormal mammogram, Anemia, Anterior uveitis, Breast cancer in female (CMS-HCC) (03/14/2017), Diabetes mellitus (CMS-HCC), and Prediabetes. who presents with sharp, left mid-upper back pain radiating into her abdomen and central chest with associated jaw tightness after passing a BM at 1400-1500 yesterday afternoon, as described below.     The patient is hemodynamically stable, afebrile, and satting 100% on RA. On exam, patient is alert, oriented, and non toxic appearing. She is intermittently dry heaving. Normal cardiopulmonary exam. Exam is notable for mild epigastric tenderness to palpation. Tenderness to palpation along right thoracic paraspinal region.     ED Course, Independent Interpretation of Studies, Discussion of Management With Other Providers, Considerations Regarding Disposition/Escalation of Care  ED Course as of 12/18/21 0604   Tue Dec 18, 2021   1969 56 year old woman who presents with 1 day of epigastric/chest pain radiating to back.  Intermittent dry heaving.  Denies any chest pain or shortness of breath.    Physical exam is notable for mild epigastric tenderness palpation, patient also with tenderness to palpation along left paraspinal region/left scapula.  She is hemodynamically stable and overall very well-appearing.    I suspect most likely viral illness such as COVID/flu versus viral gastroenteritis versus potential gastroparesis.  Also considered acute cardiopulmonary etiology such as ACS, PE, Boerhaave syndrome, aortic dissection, though patient previously presented to outside emergency department on evening of 1/9 with identical symptoms and laboratory work-up was unremarkable.  CBC and CMP within normal limits.  Troponin less than 3.  Chest x-ray with no acute cardiopulmonary process, though patient ultimately left without being seen.    Will plan to obtain repeat troponin, EKG, CBC, CMP, lipase.  GI cocktail and LR, Zofran for symptomatic management.   0241 EKG, independent interpretation:  Rate: 84 bpm  Rhythm: normal sinus rhythm  Narrative Interpretation: Rhythm as above, normal intervals, no ST segment elevations or depressions, no abnormal T-wave inversions, normal morphology.  Unchanged from prior EKG in June 2018.     0242 hsTroponin I: <3  Initial troponin less than 3.   0345 Lipase(!): 66  Mildly elevated lipase, though less than 3 times the upper limit of normal.   0346 XR Chest Portable  Individually interpreted EKG, no acute cardiopulmonary process, agree with radiology read.   1610 Reevaluated patient, still reporting ongoing nausea after administration of GI cocktail and Zofran.  Reglan allergy, will administer Compazine, plan for CTA dissection protocol given ongoing symptoms and to definitively rule out aortic dissection.   9604 Patient signed out to attending physician pending results of CTA, symptomatic reevaluation.     ____________________________________________    The case was discussed with the attending physician, who is in agreement with the above assessment and plan.      History     Chief Complaint  Chief Complaint   Patient presents with   ??? Chest Pain       HPI   Penny Gibbs is a 56 y.o. female with a PMH of T2DM, iron deficiency anemia, Stage II CKD, TIA, and breast cancer (s/p R partial mastectomy, 2018) who presents with chest pain. Patient reports sharp,  left mid-upper back pain radiating into her abdomen and central chest with associated jaw tightness after passing a BM at 1400-1500 yesterday afternoon. Her abdominal pain is aggravated with moving / ambulating. She was evaluated for this at [facility] last night but left d/t length wait time. During her evaluation there, she had 1 episode of NBNB emesis. Otherwise, no sick contacts at home. Daily medications include metformin, jardiance, victoza and methotrexate (for RA). No recent medication changes. She denies fever or urinary symptoms.    Per chart review, patient was seen in [facility] ED yesterday for evaluation of mid back pain radiating into her chest with associated nausea. Her CXR was clear. Lab work including CBC, CMP, and viral panel were all negative. Initial hsTrop was negative at 3. Patient left [facility] before evaluation was complete due to wait time.    Outside Historian(s): I have obtained additional history/collateral from sister at bedside..    External Records Reviewed: I have reviewed prior ED visit from earlier today, prior laboratory work and EKG visit from earlier today.    Past Medical History:   Diagnosis Date   ??? Abnormal mammogram    ??? Anemia    ??? Anterior uveitis     Left eye   ??? Breast cancer in female (CMS-HCC) 03/14/2017    right breast    ??? Diabetes mellitus (CMS-HCC)    ??? Prediabetes      Past Surgical History:   Procedure Laterality Date   ??? BREAST BIOPSY Right 03/11/2017    [place] core bx   ??? BREAST LUMPECTOMY Right 03/21/2017   ??? CHEMOTHERAPY Right 04/16/2017    right brca   ??? HALLUX VALGUS CORRECTION Bilateral    ??? IR INSERT PORT AGE GREATER THAN 5 YRS  04/04/2017    IR INSERT PORT AGE GREATER THAN 5 YRS 04/04/2017 Gabriel Rainwater, MD IMG VIR [facility]   ??? IR INSERT PORT AGE GREATER THAN 5 YRS  04/29/2017    IR INSERT PORT AGE GREATER THAN 5 YRS 04/29/2017 Jobe Gibbon, MD IMG VIR [facility]   ??? PR BX/REMV,LYMPH NODE,DEEP AXILL Right 03/21/2017    Procedure: BX/EXC LYMPH NODE; OPEN, DEEP AXILRY NODE;  Surgeon: Talbert Cage, DO;  Location: ASC OR [facility];  Service: Surgical Oncology   ??? PR COLONOSCOPY W/BIOPSY SINGLE/MULTIPLE N/A 10/29/2018    Procedure: COLONOSCOPY, FLEXIBLE, PROXIMAL TO SPLENIC FLEXURE; WITH BIOPSY, SINGLE OR MULTIPLE;  Surgeon: Neysa Hotter, MD;  Location: [facility] PROCEDURES [facility];  Service: Gastroenterology   ??? PR INTRAOPERATIVE SENTINEL LYMPH NODE ID W DYE INJECTION Right 03/21/2017    Procedure: INTRAOPERATIVE IDENTIFICATION SENTINEL LYMPH NODE(S) INCLUDE INJECTION NON-RADIOACTIVE DYE, WHEN PERFORMED;  Surgeon: Talbert Cage, DO;  Location: ASC OR [facility];  Service: Surgical Oncology   ??? PR MASTECTOMY, PARTIAL Right 03/21/2017    Procedure: MASTECTOMY, PARTIAL (EG, LUMPECTOMY, TYLECTOMY, QUADRANTECTOMY, SEGMENTECTOMY);  Surgeon: Talbert Cage, DO;  Location: [facility];  Service: Surgical Oncology   ??? RADIATION       No current facility-administered medications for this encounter.    Current Outpatient Medications:   ???  atorvastatin (LIPITOR) 40 MG tablet, TAKE 1 TABLET BY MOUTH EVERY DAY, Disp: 90 tablet, Rfl: 3  ???  clindamycin (CLEOCIN T) 1 % lotion, Apply topically Two (2) times a day., Disp: 120 mL, Rfl: 11  ???  diclofenac sodium (VOLTAREN) 1 % gel, Apply 2 g topically four (4) times a day., Disp: 100 g, Rfl: 1  ???  doxycycline (  VIBRA-TABS) 100 MG tablet, Take 1 tablet (100 mg total) by mouth Two (2) times a day. With food (okay to trial once a day), Disp: 180 tablet, Rfl: 1  ???  empagliflozin (JARDIANCE) 25 mg tablet, Take 1 tablet (25 mg total) by mouth daily., Disp: 90 tablet, Rfl: 3  ???  empty container Misc, USE AS DIRECTED, Disp: 1 each, Rfl: 2  ???  folic acid (FOLVITE) 1 MG tablet, Take 1 tablet (1,000 mcg total) by mouth daily., Disp: 90 tablet, Rfl: 3  ???  liraglutide (VICTOZA) injection pen, Inject 0.6 mg once daily for 1 week then increase to 1.2 mg once daily, Disp: 3 mL, Rfl: 2  ???  metFORMIN (GLUCOPHAGE) 1000 MG tablet, TAKE 1 TABLET BY MOUTH TWICE A DAY WITH MEALS, Disp: 180 tablet, Rfl: 1  ???  methotrexate 2.5 MG tablet, Take 8 tablets (20 mg total) by mouth once a week., Disp: 96 tablet, Rfl: 3  ???  methotrexate, PF, (RASUVO, PF,) 20 mg/0.4 mL AtIn, Inject the contents of 1 pen (20 mg) under the skin every seven (7) days., Disp: 12 mL, Rfl: 3  ???  pen needle, diabetic (PEN NEEDLE) 31 gauge x 5/16 (8 mm) Ndle, Injection Frequency is 1 time per day; Dx Code: Type 2 Diabetes uncontrolled (E11.65), Disp: 100 each, Rfl: 11  ???  predniSONE (DELTASONE) 10 mg tablet pack, Take as directed., Disp: , Rfl:   ???  tacrolimus (PROTOPIC) 0.1 % ointment, Apply 1 application topically Two (2) times a day. To back as needed, Disp: 100 g, Rfl: 4    Allergies  Metaclazepam hcl, Reglan [metoclopramide hcl], and Sumatriptan succinate    Family History  Family History   Problem Relation Age of Onset   ??? Hypertension Mother    ??? Diabetes Mother    ??? Diabetes Sister    ??? Hypertension Sister    ??? No Known Problems Father    ??? Diabetes Maternal Aunt    ??? Hypertension Maternal Aunt    ??? Breast cancer Maternal Aunt         ages unk   ??? Diabetes Maternal Grandmother    ??? Breast cancer Maternal Grandmother 58   ??? Breast cancer Other         4 maternal great aunt with brca   ??? No Known Problems Brother    ??? No Known Problems Maternal Uncle    ??? No Known Problems Paternal Aunt    ??? No Known Problems Paternal Uncle    ??? No Known Problems Maternal Grandfather    ??? No Known Problems Paternal Grandmother    ??? No Known Problems Paternal Grandfather    ??? No Known Problems Daughter    ??? Amblyopia Neg Hx    ??? Blindness Neg Hx    ??? Cancer Neg Hx    ??? Cataracts Neg Hx    ??? Glaucoma Neg Hx    ??? Macular degeneration Neg Hx    ??? Retinal detachment Neg Hx    ??? Strabismus Neg Hx    ??? Stroke Neg Hx    ??? Thyroid disease Neg Hx    ??? BRCA 1/2 Neg Hx    ??? Colon cancer Neg Hx    ??? Endometrial cancer Neg Hx    ??? Ovarian cancer Neg Hx    ??? Basal cell carcinoma Neg Hx    ??? Squamous cell carcinoma Neg Hx    ??? Melanoma Neg Hx      Social  History  Social History     Tobacco Use   ??? Smoking status: Never   ??? Smokeless tobacco: Never   Vaping Use   ??? Vaping Use: Never used   Substance Use Topics   ??? Alcohol use: No   ??? Drug use: No      Physical Exam     VITAL SIGNS:      Vitals: 12/17/21 2250 12/18/21 0259 12/18/21 0336 12/18/21 0500   BP: 118/82 112/70 112/78 106/64   Pulse: 80 82     Resp: 17 18     Temp: 36.7 ??C (98.1 ??F) 36.5 ??C (97.7 ??F)     TempSrc: Oral      SpO2: 100% 100% 99% 99%   Weight: (!) 109.8 kg (242 lb)      Height: 162.6 cm (5' 4)        Constitutional: Alert and oriented. She is intermittently dry heaving. Otherwise, no acute distress.  Eyes: Conjunctivae are normal.  HEENT: Normocephalic and atraumatic. Conjunctivae clear. No congestion. Moist mucous membranes.   Cardiovascular: Rate as above, regular rhythm. Normal and symmetric distal pulses. Brisk capillary refill. Normal skin turgor.  Respiratory: Normal respiratory effort. Breath sounds are normal. There are no wheezing or crackles heard.  Gastrointestinal: Mild epigastric tenderness to palpation. Soft, non-distended.  No rebound, no guarding.  Genitourinary: Deferred.  Musculoskeletal: Tenderness to palpation along right thoracic paraspinal region. Non-tender with normal range of motion in all extremities.  Neurologic: Normal speech and language. No gross focal neurologic deficits are appreciated. Patient is moving all extremities equally, face is symmetric at rest and with speech.  Skin: Skin is warm, dry and intact. No rash noted.  Psychiatric: Mood and affect are normal. Speech and behavior are normal.     Radiology     XR Chest Portable   Preliminary Result      No acute airspace process.      CTA Chest/Abd (Aortic Dissection)    (Results Pending)     Pertinent labs & imaging results that were available during my care of the patient were independently interpreted by me and considered in my medical decision making (see chart for details).    Portions of this record have been created using Scientist, clinical (histocompatibility and immunogenetics). Dictation errors have been sought, but may not have been identified and corrected.    Attestations     Documentation assistance was provided by Lenward Chancellor, Scribe on December 18, 2021 at 2:48 AM for Romeo Rabon, MD.     Documentation assistance provided by the above mentioned scribe. I was present during the time the encounter was recorded. The information recorded by the scribe was done at my direction and has been reviewed and validated by me.         Jesse Fall, MD  Resident  12/18/21 [phone]

## 2021-12-18 NOTE — Unmapped (Addendum)
Pt to triage via wc for onset of back pain and chest pain @ 1400. Had workup @ [facility] pta, left due to wait.

## 2021-12-19 DIAGNOSIS — N644 Mastodynia: Principal | ICD-10-CM

## 2021-12-19 DIAGNOSIS — Z853 Personal history of malignant neoplasm of breast: Principal | ICD-10-CM

## 2021-12-19 MED ORDER — LIRAGLUTIDE 0.6 MG/0.1 ML (18 MG/3 ML) SUBCUTANEOUS PEN INJECTOR
2 refills | 0 days | Status: CP
Start: 2021-12-19 — End: ?

## 2021-12-19 NOTE — Unmapped (Signed)
Penny Gibbs contacted me regarding some increased breast pain in her right breast. She has a history of right invasive breast cancer, diagnosed in 2018. She states that she has sharp, shooting pain in her breast, which she describes as frequent. She also has bruising on the breast itself, no known trauma to the area.    Due to Dr Garlon Hatchet' unexpected illness, scheduling was asked to place this patient on Johnette Abraham NP schedule for Friday at 0900.

## 2021-12-19 NOTE — Unmapped (Signed)
Requesting refills on Victoza

## 2021-12-21 ENCOUNTER — Ambulatory Visit: Admit: 2021-12-21 | Discharge: 2021-12-22 | Payer: PRIVATE HEALTH INSURANCE | Attending: Family | Primary: Family

## 2021-12-21 DIAGNOSIS — Z171 Estrogen receptor negative status [ER-]: Principal | ICD-10-CM

## 2021-12-21 DIAGNOSIS — C50811 Malignant neoplasm of overlapping sites of right female breast: Principal | ICD-10-CM

## 2021-12-21 MED ORDER — GABAPENTIN 300 MG CAPSULE
ORAL_CAPSULE | Freq: Every evening | ORAL | 0 refills | 30 days | Status: CP
Start: 2021-12-21 — End: 2022-12-21

## 2021-12-21 NOTE — Unmapped (Signed)
Patient Name: Penny Gibbs  Patient Age: 56 y.o.  Encounter Date: 12/21/2021     Referring Physician: Amada Kingfisher, Md  [address]  [id number]   [facility] [place],  [place] [address].     PCP: Amada Kingfisher, MD    Surgical oncology Dr. Dellis Anes  Medical oncology Dr. Garlon Hatchet  Radiation oncology Dr. Chales Abrahams    Reason for Visit: A 56 y.o. female with  breast cancer for reassessment and further management right-sided ER and PR neg/HER2 positive  breast cancer (diagnosed in  April 2018, received [facility]). She presents today with focal tenderness to the Rt breast x 2 weeks at least. On exam, there is an area of density. Will attempt to add her on for urgent imaging with MMG and/or [place] today. With negative imaging results, will trial some Tylenol and Gabapentin at bedtime.      Assessment: See detailed oncology history below.     Plan: The following issues were discussed:  - Goals of therapy: Curative.  - Current status: no evidence of metastases or recurrence  - Breast imaging: Bilateral mammography [facility] 04/17/21 BI-RADS 2 and suggest repeat in 1 year.  - Night sweats: minimal on today's visit  - [place] syndrome: Genetic disease on her father's side, he was one of 13 siblings all 13 deceased from [place]. Previously scheduled for genetic counseling and testing, missed appointment and is currently ambivalent about getting tested, but may pursue it in future. Currently with no symptoms. She has 1 sister who is also asymptomatic and has not been tested.    - Rheumatoid arthritis: Has uveitis and managed at ophthalmology on [facility] and currently on weekly low dose methotrexate.  No major visual loss: recommend that she have liver function tests done periodically given protracted use of low dose methotrexate.   - Exercise and weight: Managed by PCP.  - Rt breat pain with focal density: No same day imaging available today. Will plan for imaging on 12/25/21 instead. Trial of Tylenol/ice/supportive bra.   - HF: Mild to moderate and worse at night. Trial of Gabapentin.  - Follow-up: 04/2022 to see Muss prior to his retirement.    Interim History:   -Doing okay overall.  -Rt breast pain x 2 weeks at least, and then prior to that around the X-mas holiday had sporatic pain in the same site.  -Ice pack to chest with some relief.   -Pain seems worse, more sharp now.  -Went to ED 12/18/21 chest and back pain.  -Labs, CXR, CTA and EKG unremarkable  -Less than 4x hot flash a day.  -Mild fatigue  -Occasional skin itching.  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  Living with sister.  States occasionally walks about 30 minutes a day and working on weight control but without great success.    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 Still lives with sister (who had stroke in her 50's last year as well as COVID). Mother died in 10-Feb-2021of congestive heart failure. Excellent quality of life currently.     -Performance status 0.  .Personal and Social History: Patient is single and lives with her mother and her sister. She is a Merchandiser, retail of social services, wears mask. Marland Kitchen No children. No pets. Returned  to work September 6.2019     Oncology History Overview Note  Right breast cancer       Malignant neoplasm of overlapping sites of right breast in female, estrogen receptor negative (CMS-HCC)   02/25/2017 -  Presenting Symptoms    Screening detected B/L MMG:  FINDINGS: 2 new less than 2 cm oval circumscribed dense masses in the posterior depth LIQ  right breast. Other stable subcentimeter asymmetries are present in both breasts. No architectural distortion or malignant calcifications in either breast.     02/25/2017 Interval Scan(s)    Diagnostic Right MMG/US:  MMG: Right breast ILQ 1.1 cm adjacent masses do not efface with compression views.   ??  [place]: At the 4:00 position 10 cm from the nipple, bi-lobed parallel mass/or 2 adjacent masses with some indistinct margins, hypoechoic echotexture, and enhanced through transmission measuring 2.3 x 1.3 x 1.2 cm. ??Evaluation of the right axilla demonstrated multiple lymph nodes with prominent cortices that appear similar compared to prior mammograms dating back to 2012.     03/11/2017 Biopsy    Right breast CNBx w/clip placement:   A. 4 o'clock, 10 cm from nipple, core needle biopsy  - Invasive ductal carcinoma   - Nottingham combined histologic grade: 3  - ER Negative, PR Negative, Her2/neu+ (+3) per [facility]     03/19/2017 Tumor Board    R cT2 N0 IDC, G3, -/-/+. Stable bilateral adenopathy present on imaging. Excellent BCT candidate with or without NACT. Discussed surg 1st approach to help direct medical therapy. Meeting surgery, med/onc, rad/onc today.     03/21/2017 Surgery    Right partial mastectomy and sentinel node biopsy by Dr. Dellis Anes: Multifocal right sided breast cancer. Largest lesion 23 mm and 2 smaller lesions one 1 mm, and a second 0.2 mm. One sentinel lymph node negative. T2 N0 M0     04/09/2017 - 07/23/2017 Chemotherapy    Started TCHP echocardiogram WNL.      04/18/2017 Adverse Reaction    [place] ER visit=sob, abd pain, diarrhea, hematochezia and UTI.  CXR showed port migration to azygos vein--new port placed 04/29/17.     --C2D1 04/30/17-only given TCH--Perjeta held.  ADDENDUM only received Perjeta for C1--held for the remainder of cycles d/t diarrhea and ER visits.       05/31/2017 Adverse Reaction    ED [place] visit-persistent N/V dehydration with AKI Cr=154, required 1L IVF     07/23/2017 Adverse Reaction    Hgb=7.6 required 2 units PRBCs     08/18/2017 - 10/02/2017 Radiation      Treatment site Treatment Technique/  Modality Energy Dose per fraction Total number  of fractions Total dose Start date End date   Right whole breast Tangents with Field in [place] Mixed 6 and 15 MV/MeV 200 cGy 25 5000 cGy 08/18/2017  ?? 09/25/2017   Right breast boost [place] Electrons 15 MV/MeV 200 cGy 5 1000 cGy 09/26/2017 10/02/2017          11/25/2017 -  Immunotherapy     On-going herceptin-complete by 04/2018          Past Medical History:   Diagnosis Date   ??? Abnormal mammogram    ??? Anemia    ??? Anterior uveitis     Left eye   ??? Breast cancer in female (CMS-HCC) 03/14/2017    right breast    ??? Diabetes mellitus (CMS-HCC)    ??? Prediabetes          Current Outpatient Medications:   ???  atorvastatin (LIPITOR) 40 MG tablet, TAKE 1 TABLET BY MOUTH EVERY DAY,  Disp: 90 tablet, Rfl: 3  ???  clindamycin (CLEOCIN T) 1 % lotion, Apply topically Two (2) times a day., Disp: 120 mL, Rfl: 11  ???  diclofenac sodium (VOLTAREN) 1 % gel, Apply 2 g topically four (4) times a day., Disp: 100 g, Rfl: 1  ???  doxycycline (VIBRA-TABS) 100 MG tablet, Take 1 tablet (100 mg total) by mouth Two (2) times a day. With food (okay to trial once a day), Disp: 180 tablet, Rfl: 1  ???  empagliflozin (JARDIANCE) 25 mg tablet, Take 1 tablet (25 mg total) by mouth daily., Disp: 90 tablet, Rfl: 3  ???  empty container Misc, USE AS DIRECTED, Disp: 1 each, Rfl: 2  ???  folic acid (FOLVITE) 1 MG tablet, Take 1 tablet (1,000 mcg total) by mouth daily., Disp: 90 tablet, Rfl: 3  ???  liraglutide (VICTOZA) injection pen, Inject 0.6 mg once daily for 1 week then increase to 1.2 mg once daily, Disp: 3 mL, Rfl: 2  ???  metFORMIN (GLUCOPHAGE) 1000 MG tablet, TAKE 1 TABLET BY MOUTH TWICE A DAY WITH MEALS, Disp: 180 tablet, Rfl: 1  ???  methotrexate 2.5 MG tablet, Take 8 tablets (20 mg total) by mouth once a week., Disp: 96 tablet, Rfl: 3  ???  methotrexate, PF, (RASUVO, PF,) 20 mg/0.4 mL AtIn, Inject the contents of 1 pen (20 mg) under the skin every seven (7) days., Disp: 12 mL, Rfl: 3  ???  pen needle, diabetic (PEN NEEDLE) 31 gauge x 5/16 (8 mm) Ndle, Injection Frequency is 1 time per day; Dx Code: Type 2 Diabetes uncontrolled (E11.65), Disp: 100 each, Rfl: 11  ???  predniSONE (DELTASONE) 10 mg tablet pack, Take as directed., Disp: , Rfl:   ???  tacrolimus (PROTOPIC) 0.1 % ointment, Apply 1 application topically Two (2) times a day. To back as needed, Disp: 100 g, Rfl: 4    Review of Systems: A complete review of systems was obtained including: Constitutional, Eyes, ENT, Cardiovascular, Respiratory, GI, GU, Musculoskeletal, Skin, Neurological, Psychiatric, Endocrine, Heme/Lymphatic, and Allergic/Immunologic systems. It is negative or non-contributory to the patient???s management except for the following: None    Physical Examination:  Vital Signs: BP 125/84  - Pulse 93  - Temp 36.5 ??C (97.7 ??F) (Temporal)  - Resp 18  - Ht 162.6 cm (5' 4)  - Wt (!) 109.7 kg (241 lb 14.4 oz)  - SpO2 100%  - BMI 41.52 kg/m??   Wt Readings from Last 6 Encounters:   12/21/21 (!) 109.7 kg (241 lb 14.4 oz)   12/17/21 (!) 109.8 kg (242 lb)   10/16/21 (!) 109.7 kg (241 lb 14.4 oz)   09/18/21 (!) 110.8 kg (244 lb 3.2 oz)   09/03/21 (!) 111.6 kg (246 lb)   05/02/21 (!) 114.2 kg (251 lb 12.8 oz)   General: Well appearing woman in no acute distress. Healthy-appearing female in no acute distress..  Cardiovascular: S1 S2 with no murmurs or gallops appreciated, heart sounds somewhat muffled from habitus.  Respiratory: Lung fields clear bilaterally at bases and throughout.   Gastrointestinal:  Abdomen round, soft, non-tender with no appreciable masses or hepatosplenomegaly.   Musculoskeletal:  No bony pain or tenderness.   Psychiatric:  Alert and oriented. Affect appropriate, and interactive. .   Heme/Lymphatic/Immunologic:  No cervical or axillary adenopathy.   Upper Extremity Lymphedema: None.   Breast and regional nodes: Well healed lumpectomy on right breast with sentinel node biopsy scars. Focal tenderness in the RUQ of the Rt breast  with density noted, no edema. Left breast exam normal.

## 2021-12-25 ENCOUNTER — Ambulatory Visit: Admit: 2021-12-25 | Discharge: 2021-12-26 | Payer: PRIVATE HEALTH INSURANCE

## 2021-12-27 ENCOUNTER — Ambulatory Visit: Admit: 2021-12-27 | Discharge: 2021-12-28 | Payer: PRIVATE HEALTH INSURANCE

## 2021-12-27 DIAGNOSIS — M47819 Spondylosis without myelopathy or radiculopathy, site unspecified: Principal | ICD-10-CM

## 2021-12-27 DIAGNOSIS — H209 Unspecified iridocyclitis: Principal | ICD-10-CM

## 2021-12-27 DIAGNOSIS — Z79631 Methotrexate, long term, current use: Principal | ICD-10-CM

## 2021-12-27 NOTE — Unmapped (Addendum)
Call to make a follow up appt with Dr Beckey Downing at [phone]

## 2021-12-27 NOTE — Unmapped (Signed)
REASON FOR VISIT: f/u uveitis       HISTORY: Penny Gibbs is a 56 y.o. female with hx of uveitis and concern for spondyloarthritis. Started meloxicam 01/2019. Joint pain seems most likely related to DJD.  Additional history of breast cancer (s/p chemo/radiation, completed 04/2018).  Treatment history:  - mtx started 11/2019  - Pt reported mtx helped with joint pain, but wore off at the end of a week, so mtx increased to 20mg  12/2020  - MTX switched to SQ dosing in 10/2021  - Current med regimen: Rasuvo (mtx) 20 mg q wk, FA 1 mg qd.     Interim history:  Presents today for f/u.   Saw [facility] ophthalmology 04/2021 w/o active uveitis.     Initially Penny Gibbs still had nausea with switch to SQ mtx, but now tolerating well.   Penny Gibbs still has aches in wrists, elbows, knees. No associated swelling. AM stiffness lasting 2 hours to all day.   Sustained injury to the R elbow, hit on a counter top, resulted in tennis elbow. Had a lot of swelling and resulting numbness in this R arm. Went to ortho and was told this was tennis elbow. Had a steroid shot with improvement but not resolution of pain. Also R shoulder pain, Penny Gibbs wonders if this is due to holding her arm differently after her elbow injury.   Longstanding hx of LBP. No better with activity or rest. Waxes and wanes. Sometimes has pain radiating down the R leg,but no paresthesias. No nocturnal pain. AM stiffness of low back intermittently, sometimes lasting up to 2 hours. If Penny Gibbs has been sitting a long time, sometimes her back hurts and is very stiff. Sometimes needs someone to help her get out of the chair when this happens.     Penny Gibbs does not think Penny Gibbs has had flares of uveitis, but still has some pain intermittently in L eye.     Has had some pitting edema in legs and feet. Wonders if this is due to mtx. Discussed that I don't think this is related and recommend discussing with PCP.         CURRENT MEDICATIONS:  Current Outpatient Medications   Medication Sig Dispense Refill   ??? atorvastatin (LIPITOR) 40 MG tablet TAKE 1 TABLET BY MOUTH EVERY DAY 90 tablet 3   ??? clindamycin (CLEOCIN T) 1 % lotion Apply topically Two (2) times a day. 120 mL 11   ??? diclofenac sodium (VOLTAREN) 1 % gel Apply 2 g topically four (4) times a day. 100 g 1   ??? doxycycline (VIBRA-TABS) 100 MG tablet Take 1 tablet (100 mg total) by mouth Two (2) times a day. With food (okay to trial once a day) 180 tablet 1   ??? empagliflozin (JARDIANCE) 25 mg tablet Take 1 tablet (25 mg total) by mouth daily. 90 tablet 3   ??? empty container Misc USE AS DIRECTED 1 each 2   ??? folic acid (FOLVITE) 1 MG tablet Take 1 tablet (1,000 mcg total) by mouth daily. 90 tablet 3   ??? gabapentin (NEURONTIN) 300 MG capsule Take 1 capsule (300 mg total) by mouth nightly. 30 capsule 0   ??? liraglutide (VICTOZA) injection pen Inject 0.6 mg once daily for 1 week then increase to 1.2 mg once daily 3 mL 2   ??? metFORMIN (GLUCOPHAGE) 1000 MG tablet TAKE 1 TABLET BY MOUTH TWICE A DAY WITH MEALS 180 tablet 1   ??? methotrexate, PF, (RASUVO, PF,) 20 mg/0.4 mL AtIn Inject the  contents of 1 pen (20 mg) under the skin every seven (7) days. 12 mL 3   ??? pen needle, diabetic (PEN NEEDLE) 31 gauge x 5/16 (8 mm) Ndle Injection Frequency is 1 time per day; Dx Code: Type 2 Diabetes uncontrolled (E11.65) 100 each 11   ??? tacrolimus (PROTOPIC) 0.1 % ointment Apply 1 application topically Two (2) times a day. To back as needed 100 g 4     No current facility-administered medications for this visit.       Past Medical History:   Diagnosis Date   ??? Abnormal mammogram    ??? Anemia    ??? Anterior uveitis     Left eye   ??? Breast cancer in female (CMS-HCC) 03/14/2017    right breast    ??? Diabetes mellitus (CMS-HCC)    ??? Prediabetes         Record Review: Available records were reviewed, including pertinent office visits, labs, and imaging.      REVIEW OF SYSTEMS: Ten system were reviewed and negative except as noted above.    PHYSICAL EXAM:  VITAL SIGNS:   Vitals:    12/27/21 0842   BP: 116/76   Pulse: 86   Temp: 36.2 ??C (97.2 ??F)   TempSrc: Temporal   Weight: (!) 110.7 kg (244 lb)     General:   Pleasant 56 y.o.female in no acute distress, WDWN   Eyes:   Not injected    Cardiovascular:  Regular rate and rhythm. No murmur, rub, or gallop. No lower extremity edema.    Lungs:  Clear to auscultation.Normal respiratory effort.    Musculoskeletal:   General: Ambulates w/o assistance   Hands: No swelling or tenderness. Able to make a tight fist b/l   Wrists:FROM w/o swelling or tenderness   Elbows: Tenderness over lateral epicondyle on R, some hypopigmentation over lateral epicondyle. No effusion.   Shoulders: FROM w/o pain   Spine: occiput to wall 0 cm. Modified schober with 6 cm difference. Negative FABER. +SLR b/l. Tender midline Lspine. No tenderness over SI joints.   Knees:  FROM, crepitus on ROM   Ankles: No swelling or tenderness   Feet: no pain with MTP squeeze, no swelling or dactylitis   Psych:  Appropriate affect and mood   Skin:  No rashes.         ASSESSMENT/PLAN:  1. Spondyloarthritis  Historically has seemed more consistent with peripheral SpA. Some low back pain with history that is equivocal for inflammatory back pain. We discussed that if we suspect axial SpA, would need to consider anti-TNF therapy. Could consider MRI SI joints with contrast for further eval. Ultimately, given that the history is not overly convincing for axial disease, we decided to monitor for now, but will consider further w/u later if needed.   Continue rasuvo (mtx) sq 20 mg qwk, FA 1 mg qd.     2. Uveitis  Recommend f/u with ophthalmology to ensure that uveitis is well controlled. Number to this clinic given to pt     3. Methotrexate, long term, current use  Penny Gibbs is UTD on labs done earlier this month. Reviewed during visit, stable.           HCM:   - Pneumonia vaccines: PPSV23 09/18/20, PCV20 09/18/21  - COVID-19 vaccine status: Pfizer 04/20/20, 05/11/20. Pfizer bivalent 09/18/21  - Annual Influenza vaccine. Status: 09/03/21  - Bone health: not on prednisone   - Contraception: postmenopausal        Return  appt 3 mo with myself and 7 mo with Dr Sullivan Lone as scheduled  Greater than 30 minutes spent in visit with patient, including pre and postvisit activities.

## 2022-01-08 NOTE — Unmapped (Signed)
[facility] Clinical Assessment & Refill Coordination Note    Penny Gibbs, DOB: 1966-02-19  Phone: [phone] (home)     All above HIPAA information was verified with patient.     Was a Nurse, learning disability used for this call? No    Specialty Medication(s):   Inflammatory Disorders: Rasuvo     Current Outpatient Medications   Medication Sig Dispense Refill   ??? atorvastatin (LIPITOR) 40 MG tablet TAKE 1 TABLET BY MOUTH EVERY DAY 90 tablet 3   ??? clindamycin (CLEOCIN T) 1 % lotion Apply topically Two (2) times a day. 120 mL 11   ??? doxycycline (VIBRA-TABS) 100 MG tablet Take 1 tablet (100 mg total) by mouth Two (2) times a day. With food (okay to trial once a day) 180 tablet 1   ??? empagliflozin (JARDIANCE) 25 mg tablet Take 1 tablet (25 mg total) by mouth daily. 90 tablet 3   ??? empty container Misc USE AS DIRECTED 1 each 2   ??? folic acid (FOLVITE) 1 MG tablet Take 1 tablet (1,000 mcg total) by mouth daily. 90 tablet 3   ??? gabapentin (NEURONTIN) 300 MG capsule Take 1 capsule (300 mg total) by mouth nightly. 30 capsule 0   ??? liraglutide (VICTOZA) injection pen Inject 0.6 mg once daily for 1 week then increase to 1.2 mg once daily 3 mL 2   ??? metFORMIN (GLUCOPHAGE) 1000 MG tablet TAKE 1 TABLET BY MOUTH TWICE A DAY WITH MEALS 180 tablet 1   ??? methotrexate, PF, (RASUVO, PF,) 20 mg/0.4 mL AtIn Inject the contents of 1 pen (20 mg) under the skin every seven (7) days. 12 mL 3   ??? pen needle, diabetic (PEN NEEDLE) 31 gauge x 5/16 (8 mm) Ndle Injection Frequency is 1 time per day; Dx Code: Type 2 Diabetes uncontrolled (E11.65) 100 each 11   ??? tacrolimus (PROTOPIC) 0.1 % ointment Apply 1 application topically Two (2) times a day. To back as needed 100 g 4     No current facility-administered medications for this visit.        Changes to medications: [name] reports no changes at this time.    Allergies   Allergen Reactions   ??? Metaclazepam Hcl Hives   ??? Reglan [Metoclopramide Hcl] Shortness Of Breath States it makes her feel like her throat is closing up   ??? Sumatriptan Succinate      Other reaction(s): SWELLING/EDEMA  Other reaction(s): SWELLING/EDEMA       Changes to allergies: No    SPECIALTY MEDICATION ADHERENCE     Rasuvo PF 20mg /0.29ml: 12 days of medicine on hand     Medication Adherence    Patient reported X missed doses in the last month: 0  Specialty Medication: Rasuvo q week  Patient is on additional specialty medications: No  Informant: patient          Specialty medication(s) dose(s) confirmed: Regimen is correct and unchanged.     Are there any concerns with adherence? No    Adherence counseling provided? Not needed    CLINICAL MANAGEMENT AND INTERVENTION      Clinical Benefit Assessment:    Do you feel the medicine is effective or helping your condition? Yes    Clinical Benefit counseling provided? Not needed    Adverse Effects Assessment:    Are you experiencing any side effects? Yes, patient reports experiencing stomach upset. Side effect counseling provided: resolves quickly    Are you experiencing difficulty administering your  medicine? No    Quality of Life Assessment:    Quality of Life    Rheumatology  1. What impact has your specialty medication had on the reduction of your daily pain level?: Some  2. What impact has your specialty medication had on your ability to complete daily tasks (prepare meals, get dressed, etc...)?: Some  Oncology  Dermatology  Cystic Fibrosis          How many days over the past month did your uveitis  keep you from your normal activities? For example, brushing your teeth or getting up in the morning. 0    Have you discussed this with your provider? Not needed    Acute Infection Status:    Acute infections noted within Epic:  No active infections  Patient reported infection: None    Therapy Appropriateness:    Is therapy appropriate and patient progressing towards therapeutic goals? Yes, therapy is appropriate and should be continued    DISEASE/MEDICATION-SPECIFIC INFORMATION      For patients on injectable medications: Patient currently has 2 doses left.  Next injection is scheduled for 01/13/22.    PATIENT SPECIFIC NEEDS     - Does the patient have any physical, cognitive, or cultural barriers? No    - Is the patient high risk? No    - Does the patient require a Care Management Plan? No     SOCIAL DETERMINANTS OF HEALTH     At the [facility], we have learned that life circumstances - like trouble affording food, housing, utilities, or transportation can affect the health of many of our patients.   That is why we wanted to ask: are you currently experiencing any life circumstances that are negatively impacting your health and/or quality of life? No    Social Determinants of Health     Food Insecurity: Not on file   Tobacco Use: Low Risk    ??? Smoking Tobacco Use: Never   ??? Smokeless Tobacco Use: Never   ??? Passive Exposure: Not on file   Transportation Needs: Not on file   Alcohol Use: Not on file   Housing/Utilities: Unknown   ??? Within the past 12 months, have you ever stayed: outside, in a car, in a tent, in an overnight shelter, or temporarily in someone else's home (i.e. couch-surfing)?: No   ??? Are you worried about losing your housing?: Not on file   ??? Within the past 12 months, have you been unable to get utilities (heat, electricity) when it was really needed?: Not on file   Substance Use: Not on file   Financial Resource Strain: Not on file   Physical Activity: Not on file   Health Literacy: Low Risk    ??? : Never   Stress: Not on file   Intimate Partner Violence: Not on file   Depression: Not at risk   ??? PHQ-2 Score: 0   Social Connections: Not on file       Would you be willing to receive help with any of the needs that you have identified today? Not applicable       SHIPPING     Specialty Medication(s) to be Shipped:   Inflammatory Disorders: Rasuvo    Other medication(s) to be shipped: No additional medications requested for fill at this time     Changes to insurance: No    Delivery Scheduled: Yes, Expected medication delivery date: 01/14/22.     Medication will be delivered via Same Day Courier to  the confirmed prescription address in [facility].    The patient will receive a drug information handout for each medication shipped and additional FDA Medication Guides as required.  Verified that patient has previously received a Conservation officer, historic buildings and a Surveyor, mining.    The patient or caregiver noted above participated in the development of this care plan and knows that they can request review of or adjustments to the care plan at any time.      All of the patient's questions and concerns have been addressed.    Tera Helper   [facility] Specialty Pharmacist

## 2022-01-14 MED FILL — RASUVO (PF) 20 MG/0.4 ML SUBCUTANEOUS AUTO-INJECTOR: SUBCUTANEOUS | 84 days supply | Qty: 4.8 | Fill #1

## 2022-02-14 NOTE — Unmapped (Signed)
Patient has enough medication on hand, rescheduling refill call for 4/20

## 2022-04-03 NOTE — Unmapped (Signed)
The [facility] has made a second and final attempt to reach this patient to refill the following medication:Rasuvo.      We have left voicemails on the following phone numbers: [phone] and [phone], have sent a MyChart message, and have sent a text message to the following phone numbers: [phone] .    Dates contacted: 4/21 and 4/26  Last scheduled delivery: 01/14/22    The patient may be at risk of non-compliance with this medication. The patient should call the [facility] at [phone]  Option 4, then Option 2 (all other specialty patients) to refill medication.    Unk Lightning   [facility] Specialty Technician

## 2022-04-08 NOTE — Unmapped (Signed)
[facility] Refill Coordination Note    Specialty Medication(s) to be Shipped:   Inflammatory Disorders: Rasuvo    Other medication(s) to be shipped: No additional medications requested for fill at this time     Penny Gibbs, DOB: 1966-10-09  Phone: [phone] (home)       All above HIPAA information was verified with patient.     Was a Nurse, learning disability used for this call? No    Completed refill call assessment today to schedule patient's medication shipment from the [facility] [phone]).  All relevant notes have been reviewed.     Specialty medication(s) and dose(s) confirmed: Regimen is correct and unchanged.   Changes to medications: Penny Gibbs reports no changes at this time.  Changes to insurance: No  New side effects reported not previously addressed with a pharmacist or physician: None reported  Questions for the pharmacist: No    Confirmed patient received a Conservation officer, historic buildings and a Surveyor, mining with first shipment. The patient will receive a drug information handout for each medication shipped and additional FDA Medication Guides as required.       DISEASE/MEDICATION-SPECIFIC INFORMATION        For patients on injectable medications: Patient currently has 1 doses left.  Next injection is scheduled for 04/14/2022.    SPECIALTY MEDICATION ADHERENCE     Medication Adherence    Patient reported X missed doses in the last month: 0  Specialty Medication: Rasuvo  Patient is on additional specialty medications: No        Were doses missed due to medication being on hold? No     REFERRAL TO PHARMACIST     Referral to the pharmacist: Not needed      [facility]     Shipping address confirmed in Epic.     Delivery Scheduled: Yes, Expected medication delivery date: 04/16/2022.     Medication will be delivered via UPS to the prescription address in Epic WAM.    Lorelei Pont [facility] Specialty Technician

## 2022-04-15 MED FILL — RASUVO (PF) 20 MG/0.4 ML SUBCUTANEOUS AUTO-INJECTOR: SUBCUTANEOUS | 84 days supply | Qty: 4.8 | Fill #2

## 2022-04-16 ENCOUNTER — Ambulatory Visit
Admit: 2022-04-16 | Discharge: 2022-04-17 | Payer: PRIVATE HEALTH INSURANCE | Attending: Hematology & Oncology | Primary: Hematology & Oncology

## 2022-04-16 DIAGNOSIS — Z171 Estrogen receptor negative status [ER-]: Principal | ICD-10-CM

## 2022-04-16 DIAGNOSIS — C50811 Malignant neoplasm of overlapping sites of right female breast: Principal | ICD-10-CM

## 2022-04-16 NOTE — Unmapped (Signed)
It was a pleasure seeing you today.     It is time for me to step down. Medicine has been a major part of my life but we all need to stop practice at some time.  It has been a joy caring for you and I wish you the best.  I will arrange for Dr. Anibal Henderson, who will be joining the [facility] breast cancer group in July 2023 to assume your care.  Hy Rayette Mogg      Please call our Nurse Navigator: Lucendia Herrlich, [phone] if you have any interval questions or concerns:    For appointments & questions Monday through Friday 8 AM-- 5 PM   please call [phone]     On Nights, Weekends and Holidays  Call [phone] and ask for the oncologist on call.    [facility]  [address]  [place], [place] [address]  [email or web address]    Note if you look at test results on MyChart:  As part of the nationwide 21st Century Cures Act, which started March 19, 2020, all of your hospital and clinic notes, lab tests, pathology results, and radiology results will be posted in your [facility] as soon as they are signed. We believe sharing information builds trust and allows [place] to provide the best care to you and your loved ones.   - These changes mean you may see your results before your health care provider has a chance to review them. We may want to discuss them with you, as many results are technical and may be confusing or concerning. You may have questions and we can help you interpret results based on your case.  - Please talk to your Oncology team about this change. Your health care team can help you understand when we will have all the tests needed to guide your care.  - At times, we are waiting for more than one test result or we need to talk to another member of the care team. This may delay if we need to follow up with you to discuss your results.  - You may already have an in-person or telehealth visit to discuss your results. If you do not, please call to schedule a telehealth visit with a member of your health care team.     FOR ACCURATE AND UP-TO-DATE INFORMATION ON CANCER I SUGGEST:  a.       American Society of Clinical Oncology: http://www.cancer.net/  I think the best overall. Provides excellent information on all aspects of cancer prevention, treatment, and survivorship.  b.       American Cancer society: https://www.cancer.org/cancer.html  c.       National Cancer Institute: Also good for finding clinical trials: http://www.walter.org/  d.       [facility] [place] [facility]: Very well presented information including patient handouts for specific chemotherapy treatment plans:  http://www.bccancer.bc.ca/  e.       HERBS AND SUPPLEMENTS:  If you are taking herbs and supplements please use the following website to determine their safety: Google: [facility] Alternative medications of go to link below PhotoConference.no    FOR CAREGIVERS: Follow this link for excellent information on care giving. LookLarge.fr    EXERCISE AND NUTRITION: It's important that you exercise to keep your muscles strong.  Walking is an excellent exercise and if you're not doing so we recommend that you walk 30 minutes or more a day - 5 times a week.  This is an excellent way to keep fit.  We recommend that you eat a healthy American diet with adequate servings of fruits and vegetables.  It's important to be aware of how many calories you were taking in each day so that you can maintain a reasonable body weight. If you are having trouble with your diet or with weight control please let us know and we can refer you to our nutritionist who can give you advice and help. This website may also be helpful DiscoHelp.si.    BONE HEALTH: We recommend taking Vitamin D 1000 units per day and 1200 mg of calcium per day. You can get more information on calcium intake by Googling: Calcium NIH, or going to the following site: https://ods.CakeDeveloper.com.cy. Exercising is also most important. Some patients may need medication for bone health and your doctor may discuss this with you.    GENERAL SURVEILLANCE PLAN:These are the national guidelines from ASCO and NCCN. The recommendations for follow-up care for breast cancer include regular physical examinations, mammograms, and breast self-examinations. The follow-up care may be provided by your oncologist or primary care doctor, as long as your primary care doctor has communicated with your oncologist about appropriate follow-up care. In addition, patients with a possible or known family history of breast cancer should be referred to a Dentist.    Visit your doctor every three to six months for the first three years after the first treatment, every six to 12 months for years four and five, and every year thereafter.    Schedule a mammogram one year after your first mammogram that led to diagnosis, but no earlier than six months after radiation therapy. Obtain a mammogram every six to 12 months thereafter.    Perform a breast self-examination every month. This procedure is not a substitute for a mammogram.    Continue to visit a gynecologist regularly. Women taking tamoxifen should report any vaginal bleeding to their doctor.    In addition to the plan outlined above, please call us if you experience:   New lumps in the breast   Bone pain   Chest pain   Abdominal pain   Shortness of breath or difficulty breathing   Persistent headaches   Persistent coughing   Rash on breast   Nipple discharge (liquid coming from the nipple)     For emergencies, evenings or weekends, please call [phone] and ask for oncology fellow on call. Reasons to call emergency for patients receiving chemotherapy may include:    Fever of 100.5 or greater  Nausea and/or vomiting not relieved with nausea medicine  Diarrhea or constipation not relieved with bowel regimen  Severe pain not relieved with usual pain regimen    Labs from today if done:  No visits with results within 1 Month(s) from this visit.   Latest known visit with results is:   Admission on 12/18/2021, Discharged on 12/18/2021   Component Date Value Ref Range Status    hsTroponin I 12/17/2021 <3  <=34 ng/L Final    EKG Ventricular Rate 12/17/2021 84  BPM Final    EKG Atrial Rate 12/17/2021 84  BPM Final    EKG P-R Interval 12/17/2021 180  ms Final    EKG QRS Duration 12/17/2021 80  ms Final    EKG Q-T Interval 12/17/2021 366  ms Final    EKG QTC Calculation 12/17/2021 432  ms Final    EKG Calculated P Axis 12/17/2021 60  degrees Final    EKG Calculated R Axis 12/17/2021 83  degrees Final  EKG Calculated T Axis 12/17/2021 27  degrees Final    QTC Fredericia 12/17/2021 409  ms Final    Sodium 12/17/2021 141  135 - 145 mmol/L Final    Potassium 12/17/2021 3.9  3.4 - 4.8 mmol/L Final    Chloride 12/17/2021 107  98 - 107 mmol/L Final    CO2 12/17/2021 24.4  20.0 - 31.0 mmol/L Final    Anion Gap 12/17/2021 10  5 - 14 mmol/L Final    BUN 12/17/2021 15  9 - 23 mg/dL Final    Creatinine 16/09/9603 1.12 (H)  0.60 - 0.80 mg/dL Final    BUN/Creatinine Ratio 12/17/2021 13   Final    eGFR CKD-EPI (2021) Female 12/17/2021 59 (L)  >=60 mL/min/1.29m2 Final    eGFR calculated with CKD-EPI 2021 equation in accordance with [employer] and [employer] of Nephrology Task Force recommendations.    Glucose 12/17/2021 123  70 - 179 mg/dL Final    Calcium 54/08/8118 10.2  8.7 - 10.4 mg/dL Final    Albumin 14/78/2956 4.4  3.4 - 5.0 g/dL Final    Total Protein 12/17/2021 7.6  5.7 - 8.2 g/dL Final    Total Bilirubin 12/17/2021 0.8  0.3 - 1.2 mg/dL Final    AST 21/30/8657 17  <=34 U/L Final    ALT 12/17/2021 15  10 - 49 U/L Final    Alkaline Phosphatase 12/17/2021 69  46 - 116 U/L Final    WBC 12/18/2021 6.0  3.6 - 11.2 10*9/L Final    RBC 12/18/2021 4.74  3.95 - 5.13 10*12/L Final    HGB 12/18/2021 13.3  11.3 - 14.9 g/dL Final    HCT 84/69/6295 39.4  34.0 - 44.0 % Final    MCV 12/18/2021 83.2  77.6 - 95.7 fL Final    MCH 12/18/2021 28.0  25.9 - 32.4 pg Final    MCHC 12/18/2021 33.7  32.0 - 36.0 g/dL Final    RDW 28/41/3244 16.4 (H)  12.2 - 15.2 % Final    MPV 12/18/2021 9.6  6.8 - 10.7 fL Final    Platelet 12/18/2021 258  150 - 450 10*9/L Final    nRBC 12/18/2021 0  <=4 /100 WBCs Final    Neutrophils % 12/18/2021 65.1  % Final    Lymphocytes % 12/18/2021 26.4  % Final    Monocytes % 12/18/2021 7.4  % Final    Eosinophils % 12/18/2021 0.5  % Final    Basophils % 12/18/2021 0.6  % Final    Absolute Neutrophils 12/18/2021 3.9  1.8 - 7.8 10*9/L Final    Absolute Lymphocytes 12/18/2021 1.6  1.1 - 3.6 10*9/L Final    Absolute Monocytes 12/18/2021 0.4  0.3 - 0.8 10*9/L Final    Absolute Eosinophils 12/18/2021 0.0  0.0 - 0.5 10*9/L Final    Absolute Basophils 12/18/2021 0.0  0.0 - 0.1 10*9/L Final    Lipase 12/17/2021 66 (H)  12 - 53 U/L Final        Servando Salina, MD  Breast Medical Oncology   [facility]   [address]   [place], [place] [address]   Fax: [phone]

## 2022-04-16 NOTE — Unmapped (Signed)
Patient Name: Penny Gibbs  Patient Age: 56 y.o.  Encounter Date: 04/16/2022     Referring Physician: Marcy Panning, Fnp  [address]  [address] [place] [address]  [place],  [place] [address].     PCP: Penny Kingfisher, MD    Surgical oncology Dr. Dellis Gibbs  Medical oncology Dr. Garlon Gibbs  Radiation oncology Dr. Chales Gibbs    Reason for Visit: A 56 y.o. female with  breast cancer for reassessment and further management right-sided ER and PR neg/HER2 positive  breast cancer (diagnosed in  April 2018, received [facility]). Currently NED.     Assessment: See detailed oncology history below.     Plan: The following issues were discussed:  - Goals of therapy: Curative.  - Current status: no evidence of metastases or recurrence  - Breast imaging: Bilateral mammography [facility] 04/17/21 BI-RADS 2 and suggest repeat in 1 year.  - Night sweats: minimal on today's visit  - [place] syndrome: Genetic disease on her father's side, he was one of 13 siblings all 13 deceased from [place]. Previously scheduled for genetic counseling and testing, missed appointment and is currently ambivalent about getting tested, but may pursue it in future. Currently with no symptoms. She has 1 sister who is also asymptomatic and has not been tested.  No symptoms suggestive of syndrome.   - Rheumatoid arthritis: Has uveitis and managed at ophthalmology on [facility] and currently on weekly low dose methotrexate.  No major visual loss: recommend that she have liver function tests done periodically given protracted use of low dose methotrexate.   - Exercise and weight: Managed by PCP.  Patient has diabetes and BMI substantially elevated and suggest just she discuss semaglutide or other medication with PCP  - Follow-up: Follow up in visit in 12 months Dr. Artis Gibbs who will be assuming my my practice.  It has been a delight to care for Penny Gibbs.  She is a delightful woman is doing well at this time and has an excellent prognosis.    Interim History:   -Doing well overall.  -No symptoms suggesting recurrent breast cancer.   -Only grade 2 symptom is several hot flashes per day.  Grade 1 symptoms include mild fatigue, 5 to 7 pound weight gain, some numbness and tingling in extremities probably residual of chemotherapy, some intermittent swelling of her extremities, and some occasional headaches.  She also has increase in frequency of urination at night up to twice normal.  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  I told her that she needs to think about what she will do after retirement as she is a young woman.  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  Living with sister.  States occasionally walks about 30 minutes a day and working on weight control but without great success.    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 Still lives with sister (who had stroke in her 79's last year as well as COVID). Mother died in 01-28-21of congestive heart failure. Excellent quality of life currently.     -Performance status 0.  .Personal and Social History: Patient is single and lives with her mother and her  sister. She is a Merchandiser, retail of social services, wears mask. Marland Kitchen No children. No pets. Returned  to work September 6.2019     Oncology History Overview Note   Right breast cancer         Malignant neoplasm of overlapping sites of right breast in female, estrogen receptor negative (CMS-HCC)   02/25/2017 -  Presenting Symptoms    Screening detected B/L MMG:  FINDINGS: 2 new less than 2 cm oval circumscribed dense masses in the posterior depth LIQ  right breast. Other stable subcentimeter asymmetries are present in both breasts. No architectural distortion or malignant calcifications in either breast.       02/25/2017 Interval Scan(s)    Diagnostic Right MMG/US:  MMG: Right breast ILQ 1.1 cm adjacent masses do not efface with compression views.      [place]: At the 4:00 position 10 cm from the nipple, bi-lobed parallel mass/or 2 adjacent masses with some indistinct margins, hypoechoic echotexture, and enhanced through transmission measuring 2.3 x 1.3 x 1.2 cm.  Evaluation of the right axilla demonstrated multiple lymph nodes with prominent cortices that appear similar compared to prior mammograms dating back to 2012.       03/11/2017 Biopsy    Right breast CNBx w/clip placement:   A. 4 o'clock, 10 cm from nipple, core needle biopsy  - Invasive ductal carcinoma   - [place] combined histologic grade: 3  - ER Negative, PR Negative, Her2/neu+ (+3) per [facility]     03/19/2017 Tumor Board    R cT2 N0 IDC, G3, -/-/+. Stable bilateral adenopathy present on imaging. Excellent BCT candidate with or without NACT. Discussed surg 1st approach to help direct medical therapy. Meeting surgery, med/onc, rad/onc today.       03/21/2017 Surgery    Right partial mastectomy and sentinel node biopsy by Dr. Dellis Gibbs: Multifocal right sided breast cancer. Largest lesion 23 mm and 2 smaller lesions one 1 mm, and a second 0.2 mm. One sentinel lymph node negative. T2 N0 M0       04/09/2017 - 07/23/2017 Chemotherapy    Started TCHP echocardiogram WNL.        04/18/2017 Adverse Reaction    [place] ER visit=sob, abd pain, diarrhea, hematochezia and UTI.  CXR showed port migration to azygos vein--new port placed 04/29/17.     --C2D1 04/30/17-only given TCH--Perjeta held.  ADDENDUM only received Perjeta for C1--held for the remainder of cycles d/t diarrhea and ER visits.         05/31/2017 Adverse Reaction    [facility] [place] visit-persistent N/V dehydration with AKI Cr=154, required 1L IVF       07/23/2017 Adverse Reaction    Hgb=7.6 required 2 units PRBCs       08/18/2017 - 10/02/2017 Radiation      Treatment site Treatment Technique/  Modality Energy Dose per fraction Total number  of fractions Total dose Start date End date   Right whole breast Tangents with Field in [place] Mixed 6 and 15 MV/MeV 200 cGy 25 5000 cGy 08/18/2017    09/25/2017   Right breast boost Enface Electrons 15 MV/MeV 200 cGy 5 1000 cGy 09/26/2017 10/02/2017          11/25/2017 -  Immunotherapy     On-going herceptin-complete by 04/2018            Past Medical History:   Diagnosis Date    Abnormal mammogram     Anemia     Anterior uveitis     Left eye  Breast cancer in female (CMS-HCC) 03/14/2017    right breast     Diabetes mellitus (CMS-HCC)     Prediabetes          Current Outpatient Medications:     atorvastatin (LIPITOR) 40 MG tablet, TAKE 1 TABLET BY MOUTH EVERY DAY, Disp: 90 tablet, Rfl: 3    clindamycin (CLEOCIN T) 1 % lotion, Apply topically Two (2) times a day., Disp: 120 mL, Rfl: 11    doxycycline (VIBRA-TABS) 100 MG tablet, Take 1 tablet (100 mg total) by mouth Two (2) times a day. With food (okay to trial once a day), Disp: 180 tablet, Rfl: 1    empagliflozin (JARDIANCE) 25 mg tablet, Take 1 tablet (25 mg total) by mouth daily., Disp: 90 tablet, Rfl: 3    empty container Misc, USE AS DIRECTED, Disp: 1 each, Rfl: 2    folic acid (FOLVITE) 1 MG tablet, Take 1 tablet (1,000 mcg total) by mouth daily., Disp: 90 tablet, Rfl: 3    liraglutide (VICTOZA) injection pen, Inject 0.6 mg once daily for 1 week then increase to 1.2 mg once daily, Disp: 3 mL, Rfl: 2    metFORMIN (GLUCOPHAGE) 1000 MG tablet, TAKE 1 TABLET BY MOUTH TWICE A DAY WITH MEALS, Disp: 180 tablet, Rfl: 1    methotrexate, PF, (RASUVO, PF,) 20 mg/0.4 mL AtIn, Inject the contents of 1 pen (20 mg) under the skin every seven (7) days., Disp: 12 mL, Rfl: 3    pen needle, diabetic (PEN NEEDLE) 31 gauge x 5/16 (8 mm) Ndle, Injection Frequency is 1 time per day; Dx Code: Type 2 Diabetes uncontrolled (E11.65), Disp: 100 each, Rfl: 11    gabapentin (NEURONTIN) 300 MG capsule, Take 1 capsule (300 mg total) by mouth nightly. (Patient not taking: Reported on 04/16/2022), Disp: 30 capsule, Rfl: 0    tacrolimus (PROTOPIC) 0.1 % ointment, Apply 1 application topically Two (2) times a day. To back as needed (Patient not taking: Reported on 04/16/2022), Disp: 100 g, Rfl: 4    Review of Systems: A complete review of systems was obtained including: Constitutional, Eyes, ENT, Cardiovascular, Respiratory, GI, GU, Musculoskeletal, Skin, Neurological, Psychiatric, Endocrine, Heme/Lymphatic, and Allergic/Immunologic systems. It is negative or non-contributory to the patient???s management except for the following: None    Physical Examination:  Vital Signs: BP 120/79  - Pulse 81  - Temp 36.7 ??C (98.1 ??F) (Temporal)  - Resp 18  - Ht 162.6 cm (5' 4)  - Wt (!) 113 kg (249 lb 1.9 oz)  - SpO2 99%  - BMI 42.76 kg/m??   Wt Readings from Last 6 Encounters:   04/16/22 (!) 113 kg (249 lb 1.9 oz)   12/27/21 (!) 110.7 kg (244 lb)   12/21/21 (!) 109.7 kg (241 lb 14.4 oz)   12/17/21 (!) 109.8 kg (242 lb)   10/16/21 (!) 109.7 kg (241 lb 14.4 oz)   09/18/21 (!) 110.8 kg (244 lb 3.2 oz)   General: Well appearing woman in no acute distress. Healthy-appearing female in no acute distress..  Cardiovascular: S1 S2 with no murmurs or gallops appreciated, heart sounds somewhat muffled from habitus.  Respiratory: Lung fields clear bilaterally at bases and throughout.   Gastrointestinal:  Abdomen round, soft, non-tender with no appreciable masses or hepatosplenomegaly.   Musculoskeletal:  No bony pain or tenderness.   Psychiatric:  Alert and oriented. Affect appropriate, and interactive. .   Heme/Lymphatic/Immunologic:  No cervical or axillary adenopathy.   Upper Extremity Lymphedema: None.   Breast  and regional nodes: Well healed lumpectomy on right breast with sentinel node biopsy scars, no masses, edema or evidence of recurrence.  Left breast exam normal.       Portions of this note were created using Dragon voice recognition software. Minor spelling, syntax errors, grammatical content or punctuation errors may have occurred unintentionally. Please notify Thereasa Parkin if changes are necessary. While every attempt has been made to minimize errors, some may still be present.  - I have reviewed all relevant  communications and/or test results from external MD/APP, facility, or healthcare organizations.  - I personally spent 35 minutes face-to-face and non-face-to-face in the care of this patient, which includes all pre, intra, and post visit time on the date of service.

## 2022-04-25 ENCOUNTER — Ambulatory Visit: Admit: 2022-04-25 | Discharge: 2022-04-26 | Payer: PRIVATE HEALTH INSURANCE

## 2022-04-25 DIAGNOSIS — H209 Unspecified iridocyclitis: Principal | ICD-10-CM

## 2022-04-25 DIAGNOSIS — M47819 Spondylosis without myelopathy or radiculopathy, site unspecified: Principal | ICD-10-CM

## 2022-04-25 DIAGNOSIS — Z79899 Other long term (current) drug therapy: Principal | ICD-10-CM

## 2022-04-25 LAB — CBC W/ AUTO DIFF
BASOPHILS ABSOLUTE COUNT: 0 10*9/L (ref 0.0–0.1)
BASOPHILS RELATIVE PERCENT: 0.7 %
EOSINOPHILS ABSOLUTE COUNT: 0.1 10*9/L (ref 0.0–0.5)
EOSINOPHILS RELATIVE PERCENT: 0.9 %
HEMATOCRIT: 38.3 % (ref 34.0–44.0)
HEMOGLOBIN: 12.6 g/dL (ref 11.3–14.9)
LYMPHOCYTES ABSOLUTE COUNT: 1.8 10*9/L (ref 1.1–3.6)
LYMPHOCYTES RELATIVE PERCENT: 29.9 %
MEAN CORPUSCULAR HEMOGLOBIN CONC: 32.8 g/dL (ref 32.0–36.0)
MEAN CORPUSCULAR HEMOGLOBIN: 27.5 pg (ref 25.9–32.4)
MEAN CORPUSCULAR VOLUME: 83.6 fL (ref 77.6–95.7)
MEAN PLATELET VOLUME: 9.3 fL (ref 6.8–10.7)
MONOCYTES ABSOLUTE COUNT: 0.3 10*9/L (ref 0.3–0.8)
MONOCYTES RELATIVE PERCENT: 5.6 %
NEUTROPHILS ABSOLUTE COUNT: 3.8 10*9/L (ref 1.8–7.8)
NEUTROPHILS RELATIVE PERCENT: 62.9 %
PLATELET COUNT: 243 10*9/L (ref 150–450)
RED BLOOD CELL COUNT: 4.58 10*12/L (ref 3.95–5.13)
RED CELL DISTRIBUTION WIDTH: 16.5 % — ABNORMAL HIGH (ref 12.2–15.2)
WBC ADJUSTED: 6 10*9/L (ref 3.6–11.2)

## 2022-04-25 LAB — ALBUMIN: ALBUMIN: 4 g/dL (ref 3.4–5.0)

## 2022-04-25 LAB — BUN: BLOOD UREA NITROGEN: 16 mg/dL (ref 9–23)

## 2022-04-25 LAB — AST: AST (SGOT): 13 U/L (ref <=34)

## 2022-04-25 LAB — CREATININE
CREATININE: 1.1 mg/dL — ABNORMAL HIGH
EGFR CKD-EPI (2021) FEMALE: 60 mL/min/{1.73_m2} (ref >=60)

## 2022-04-25 LAB — ALT: ALT (SGPT): 13 U/L (ref 10–49)

## 2022-04-25 NOTE — Unmapped (Unsigned)
Last clinic visit: 12/27/2021 with Carlus Pavlov PA-c     Accompanied by: Randie Heinz niece    Chief complaint: Follow-up uveitis and spondyloarthritis     History of Present Illness:     HPI:  Penny Gibbs is a 56 y.o. female  with a history of hidradenitis suppurativa, CKD stage 2, morbid obesity and breast cancer (s/p chemo/radiation end in 04/2018) here to follow-up on uveitis and spondyloarthritis. Pt first developed uveitis end of May 2019 with flare Sept 2019, initial symptoms pain and redness. Now seeing Dr. Carlynn Purl at [facility], started meloxicam Feb 2020. At first appt in Oct 2019, noted that she was HLA-B27, ANA, RF, CCP, and ANCA neg, OA noted at 1st MTPs bilat on foot XR. At that time, pain did not sound inflammatory in nature and felt more likely due to obesity and degenerative disease.    Treatment history:  - Methotrexate started 11/2019  - Pt reported mtx helped with joint pain, but wore off at the end of a week, so mtx increased to 20 mg PO Jan 2022  - MTX switched to SQ dosing in 10/2021    Events since last appt:   - Seen on 04/16/2022 by oncology for follow up on breast cancer.    Current Treatment:  - Methotrexate 20 mg (8 tabs) PO q week, folic acid 1 mg qday.     Interval History:  - Letter from insurance last month said they will not cover Rasuvo shots starting June 1, but would cover ? Otrexup instead. She will follow up and confirm name of medication.  - Her joints are occasionally stiff and achy.  - Endorses 15 minutes of morning stiffness.   - Doing well on methotrexate. Denies fevers, chills, sores, rash.  - Her vision is blurry. She had an episode of uveitis and associated pain for which she presented to ED and was eventually started on methotrexate at [facility].  - Her hands go numb while sleeping. She occasionally wakes up at night and shakes her hands to resolve numbness. Her arms also swell at night.  - Since 2 months ago, she has had L shoulder and arm swelling. The pain worsened after a fall in which she landed on her L side. Her chiropractor found nothing broken.    Objective    Physical Exam:  Vitals:    04/25/22 1542   BP: 120/78   BP Site: L Arm   BP Position: Sitting   BP Cuff Size: Large   Pulse: 84   Temp: 36.2 ??C (97.2 ??F)   TempSrc: Temporal   Weight: (!) 113.9 kg (251 lb)     Physical Exam:  Vitals:    04/25/22 1542   BP: 120/78   BP Site: L Arm   BP Position: Sitting   BP Cuff Size: Large   Pulse: 84   Temp: 36.2 ??C (97.2 ??F)   TempSrc: Temporal   Weight: (!) 113.9 kg (251 lb)     Body mass index is 43.08 kg/m??.  GENERAL: The patient is well appearing, in no acute distress. Ambulates around exam room easily and climbs on exam table without difficulty.  SKIN: No rash.   EYES: EOMI, PERRL. Sclera anicteric, conjunctiva non- injected.   ENT: mucus membranes moist.   Neck: supple, no cervical lymphadenopathy  Respiratory: Breathing non-labored, CTA bilaterally  CV: Heart rate regular, no murmurs  GI: Abdomen soft, nontender, nondistended, no hepatosplenomegaly  VASCULAR: warm and well perfused extremities, no c/c/e.  NEURO: CN  2-12 grossly intact.   PSYCH: No depression or anxiety. Cooperative. Alert and oriented.   MUSCULOSKELETAL:   Neck with painless, unlimited ROM.??   Bilateral shoulders, elbows, wrists, hands, fingers:  No deformity, erythema, warmth, swelling, effusion, tenderness, or limited ROM.?? Able to curl all fingers. Prayer sign negative. Except: R wrist TTP, palpating L 2 3 5  MCP causes electric shooting pain up her arm.  Hips without limited ROM.?? Except: External rotation with pain the the inguinal region bilaterally.  Bilateral knees, ankles: No deformity, erythema, warmth, swelling, effusion, tenderness, or limited ROM.       Assessment/Plan:     Penny Gibbs is a 56 y.o. female with a history of morbid obesity and breast cancer (s/p chemo/radiation end in 04/2018) who is being seen for follow-up of uveitis and spondyloarthritis. Pt first developed uveitis end of May 2019 with flare Sept 2019. Now seeing Dr. Carlynn Purl at [facility], started meloxicam Feb 2020. At first appt in Oct 2019, noted that she was HLA-B27, ANA, RF, CCP, and ANCA neg, OA noted at 1st MTPs bilat on foot XR. At that time, pain did not sound inflammatory in nature and felt more likely due to obesity and degenerative disease. However, subsequently developed joint pain improved with methotrexate.       1: Uveitis: Pt without symptoms, last optho appt May 2022 uveitis was quiet.   - Will continue methotrexate sq for nausea and joint pain.   - Her insurance will not cover Rasuvo shots starting June 1, but will cover ?Otrexup instead. Follow up with patient to confirm name of medication.     2. Spondyloarthritis: Discussed with pt hard to know how much of her joint pain is due to inflammatory arthritis vs other etiologies. I do think she has SpA as joint pain improved with methotrexate, but may also have OA and now with lateral epicondylitis. Previously also raised question of gout. Discussed option of increasing methotrexate or switching to shots to decrease nausea (side effect of treatment) and improve absorption which might help joint pain more if due to SpA.  - Will check uric acid with next labs given hx of gout, though seems less likely to be causing joint pain.   - Continue methotrexate subcutaneous 20 mg weekly. Continue folic acid 1 mg daily.      3: High risk medication monitoring:   - Patient is currently taking methotrexate, an immunosuppressant medication that requires intensive monitoring. This monitoring was done today through history, physical, and/or lab testing.   - Will check labs today and every 3 months on stable dose of methotrexate to monitor for signs of medication toxicity.      4. Immunization Counseling:  - Influenza: Sept 2022   - Pneumococcal 23: 09/18/2020   - Prevnar PCV-20: 09/18/2021    Covid Vaccination:   Dose 1 04/20/2020  Dose 2 05/11/2020  Bivalent Booster: 09/18/2021   Discussed that she is eligible for a second COVID booster.      We discussed the above including diagnosis and recommendations, agreed on the above plan, and all questions were answered.    Follow-up: No follow-ups on file.    Danella Maiers, MD, MSCI  Assistant Professor of Medicine  Department of Medicine/Division of Rheumatology  [place] [facility]  [phone] clinic phone  [phone] clinic secure fax      We appreciate the opportunity to participate in the care of this patient. I personally spent *** minutes face-to-face and  non-face-to-face in the care of this patient, which includes all pre, intra, and post visit time on the date of service.       There are no diagnoses linked to this encounter.    Scribe's Attestation: Danella Maiers, MD obtained and performed the history, physical exam and medical decision making elements that were entered into the chart.  Signed by Leim Fabry, Scribe, on Apr 25, 2022 4:11 PM     ----------------------------------------------------------------------------------------------------------------------  Apr 25, 2022 4:11 PM   Documentation assistance provided by the Scribe. I was present during the time the encounter was recorded. The information recorded by the Scribe was done at my direction and has been reviewed and validated by me.  ----------------------------------------------------------------------------------------------------------------------

## 2022-04-26 NOTE — Unmapped (Signed)
Sent the pt the following message:    Results from your recent lab tests were normal or without significant abnormalities.     Please let us know if you have any questions.    Dr. Nella Botsford

## 2022-05-04 NOTE — Unmapped (Signed)
Radiation Oncology Follow Up Note    Patient: Penny Gibbs  VISIT DATE: 05/08/2022  MRN: [id number]   Chief Complaint:  Breast cancer      Treatment: 5000 cGy in 25 fx plus 1000cGy boost to whole breast  Interval: 4 years 7 mos (completed October 2018)    Assessment:   Penny Gibbs is a 56 y.o. F with pT2N0 ER-/PR-/Her2 (3+)amplified, G3, right breast multifocal (ILQ) IDC s/p lumpectomy on 03/21/17 w/ 0/1 LN, followed by adjuvant TCHP completed on 08/13/17, s/p radiation therapy to her right breast and lumpectomy site finished 09/26/17.     Plan:  Breast cancer:   Discharge from radiation oncology  MMG in one year combine with Dr Artis Flock appt  ER/PR negative, no ET.       Subjective/Interval History:   Penny Gibbs returns for a regularly scheduled follow up.   She reports she has overall been well, perhaps getting ready to retire by year end.   No new breast concerns.       ROS:  A comprehensive review of 10 systems was negative except for pertinent positives noted in HPI.    Oncology History Overview Note   Right breast cancer         Malignant neoplasm of overlapping sites of right breast in female, estrogen receptor negative (CMS-HCC)   02/25/2017 -  Presenting Symptoms    Screening detected B/L MMG:  FINDINGS: 2 new less than 2 cm oval circumscribed dense masses in the posterior depth LIQ  right breast. Other stable subcentimeter asymmetries are present in both breasts. No architectural distortion or malignant calcifications in either breast.       02/25/2017 Interval Scan(s)    Diagnostic Right MMG/US:  MMG: Right breast ILQ 1.1 cm adjacent masses do not efface with compression views.      [place]: At the 4:00 position 10 cm from the nipple, bi-lobed parallel mass/or 2 adjacent masses with some indistinct margins, hypoechoic echotexture, and enhanced through transmission measuring 2.3 x 1.3 x 1.2 cm.  Evaluation of the right axilla demonstrated multiple lymph nodes with prominent cortices that appear similar compared to prior mammograms dating back to 2012.       03/11/2017 Biopsy    Right breast CNBx w/clip placement:   A. 4 o'clock, 10 cm from nipple, core needle biopsy  - Invasive ductal carcinoma   - Nottingham combined histologic grade: 3  - ER Negative, PR Negative, Her2/neu+ (+3) per [facility]     03/19/2017 Tumor Board    R cT2 N0 IDC, G3, -/-/+. Stable bilateral adenopathy present on imaging. Excellent BCT candidate with or without NACT. Discussed surg 1st approach to help direct medical therapy. Meeting surgery, med/onc, rad/onc today.       03/21/2017 Surgery    Right partial mastectomy and sentinel node biopsy by Dr. Dellis Anes: Multifocal right sided breast cancer. Largest lesion 23 mm and 2 smaller lesions one 1 mm, and a second 0.2 mm. One sentinel lymph node negative. T2 N0 M0       04/09/2017 - 07/23/2017 Chemotherapy    Started TCHP echocardiogram WNL.        04/18/2017 Adverse Reaction    [place] ER visit=sob, abd pain, diarrhea, hematochezia and UTI.  CXR showed port migration to azygos vein--new port placed 04/29/17.     --C2D1 04/30/17-only given TCH--Perjeta held.  ADDENDUM only received Perjeta for C1--held for the remainder of cycles d/t diarrhea and ER visits.  05/31/2017 Adverse Reaction    ED [place] visit-persistent N/V dehydration with AKI Cr=154, required 1L IVF       07/23/2017 Adverse Reaction    Hgb=7.6 required 2 units PRBCs       08/18/2017 - 10/02/2017 Radiation      Treatment site Treatment Technique/  Modality Energy Dose per fraction Total number  of fractions Total dose Start date End date   Right whole breast Tangents with Field in [place] Mixed 6 and 15 MV/MeV 200 cGy 25 5000 cGy 08/18/2017    09/25/2017   Right breast boost Enface Electrons 15 MV/MeV 200 cGy 5 1000 cGy 09/26/2017 10/02/2017          11/25/2017 -  Immunotherapy     On-going herceptin-complete by 04/2018         Physical Examination:      General: Appears well  HEENT:  Anicteric, mask in place.  Neck: Supple  Lymph:  No cervical, supraclav or axillary LAN  Respiratory: Normal work of breathing  HEART:  RRR  Musculoskeletal: Independent gait  Skin: Warm, dry.   Neurologic: Alert, oriented x 3.   Psychiatric: appropriate affect  Breast: Good cosmesis and symmetry.  R breast surgical scar noted, no palpable concerns in either R or L breast.  No change  EXTREMITIES:  No UE edema     Most Recent Mammogram:  05/08/22  BI-RADS Category 2 benign annual mammography recommended.    ----------------------------------------------------------------------------------------------------------------------  May 03, 2022 7:43 PM. Documentation assistance provided by Hetty Blend, medical scribe, at the direction of Alan Ripper, MD.  ----------------------------------------------------------------------------------------------------------------------  Roseanne Reno. Chales Abrahams, MD PhD  Department of Radiation Oncology  [facility] [place] [facility]

## 2022-05-05 MED ORDER — OTREXUP (PF) 20 MG/0.4 ML SUBCUTANEOUS AUTO-INJECTOR
SUBCUTANEOUS | 3 refills | 210 days | Status: CP
Start: 2022-05-05 — End: ?

## 2022-05-08 ENCOUNTER — Ambulatory Visit: Admit: 2022-05-08 | Discharge: 2022-05-08 | Payer: PRIVATE HEALTH INSURANCE

## 2022-05-08 ENCOUNTER — Ambulatory Visit
Admit: 2022-05-08 | Discharge: 2022-05-08 | Payer: PRIVATE HEALTH INSURANCE | Attending: Radiation Oncology | Primary: Radiation Oncology

## 2022-05-08 ENCOUNTER — Ambulatory Visit: Admit: 2022-05-08 | Payer: PRIVATE HEALTH INSURANCE | Attending: Radiation Oncology | Primary: Radiation Oncology

## 2022-05-08 NOTE — Unmapped (Signed)
05/08/2022      Subjective/Assessment/Recommendations:    1. Fatigue: No issues  2. Pain: Denies pain  3. Skin: No problems  4. Lymphedema: Not present  5. Prescription Needs: None  6. Psychosocial: Has home support  7. Surveillance: Mammogram today  8: Hormones: Hot flashes

## 2022-05-13 ENCOUNTER — Ambulatory Visit: Admit: 2022-05-13 | Discharge: 2022-05-14 | Payer: PRIVATE HEALTH INSURANCE

## 2022-05-13 DIAGNOSIS — L720 Epidermal cyst: Principal | ICD-10-CM

## 2022-05-13 DIAGNOSIS — L739 Follicular disorder, unspecified: Principal | ICD-10-CM

## 2022-05-13 DIAGNOSIS — L732 Hidradenitis suppurativa: Principal | ICD-10-CM

## 2022-05-13 MED ORDER — CLINDAMYCIN 1 % LOTION
Freq: Every day | TOPICAL | 11 refills | 0 days | Status: CP
Start: 2022-05-13 — End: 2023-05-13

## 2022-05-13 MED ORDER — DOXYCYCLINE HYCLATE 50 MG CAPSULE
ORAL_CAPSULE | Freq: Every day | ORAL | 5 refills | 30 days | Status: CP
Start: 2022-05-13 — End: 2022-11-09

## 2022-05-13 NOTE — Unmapped (Signed)
How long to wait for surgery?

## 2022-05-13 NOTE — Unmapped (Signed)
-----   Message from Ardith Dark, MD sent at 05/13/2022 10:35 AM EDT -----  No need for wait  ----- Message -----  From: Ricky Stabs  Sent: 05/13/2022  10:28 AM EDT  To: Ardith Dark, MD          ----- Message -----  From: Ardith Dark, MD  Sent: 05/13/2022   9:02 AM EDT  To: Donzetta Kohut, MD    Cleone Slim Rosalita Chessman,    Can we schedule patient for removal EIC left earlobe?    Diagnosis:   face, ears, lids, nose, lip .6-1 cm  11441  face, ears, eyelids, nose, lips 2.5cm or less  [id number]

## 2022-05-13 NOTE — Unmapped (Unsigned)
Dermatology Note    Assessment and Plan:      Hidradenitis suppurativa  Chronic, improved, stable  - doxycycline (VIBRAMYCIN) 50 MG capsule; Take 1 capsule (50 mg total) by mouth in the morning.  - clindamycin (CLEOCIN T) 1 % lotion; Apply topically daily.    Epidermal inclusion cyst  Left posterior earlobe, flaring  - Will schedule for surgical removal given a number of cycles of inflammation and pain    Folliculitis/pseudofolliculitis barbae  Appears multifactorial, acne/folliculitis like and some PFB  - tretinoin (RETIN-A) 0.025 % cream; Apply pea size amt to face 2-3 nights weekly.  Increase frequency up to nightly as dryness permits  - clindamycin (CLEOCIN T) 1 % lotion; Apply topically daily.    The patient was advised to call for an appointment should any new, changing, or symptomatic lesions develop.     RTC: Return in about 6 months (around 11/12/2022). or sooner as needed   _________________________________________________________________      Chief Complaint     Chief Complaint   Patient presents with    HS     PLACE BEHIND EAR AND CHIN        HPI     Penny Gibbs is a 56 y.o. female who presents as a returning patient (last seen 11/12/2021) for follow up of hidradenitis and two bumps of concern. First lesion is behind her left ear that becomes intermittently inflamed. Has drained in the past but did not drain this time. Also reporting some inflamed bumps of the jawline and chin. She has been using a foaming cleanser. She reports that her HS is doing very well. No new papules have formed and she reports no flares when she decreased doxycycline from 100 mg twice daily to once daily.     The patient denies any other new or changing lesions or areas of concern.     Pertinent Past Medical History     Problem List          Musculoskeletal and Integument    Hidradenitis suppurativa - Primary    Relevant Medications    doxycycline (VIBRAMYCIN) 50 MG capsule    tretinoin (RETIN-A) 0.025 % cream    clindamycin (CLEOCIN T) 1 % lotion     Past Medical History, Family History, Social History, Medication List, Allergies, and Problem List were reviewed in the rooming section of Epic.     ROS: Other than symptoms mentioned in the HPI, no fevers, chills, or other skin complaints    Physical Examination     GENERAL: Well-appearing female in no acute distress, resting comfortably.  NEURO: Alert and oriented, answers questions appropriately  PSYCH: Normal mood and affect  EYES: lids clear, conjunctiva pink, no scleral icterus or other color change  RESP: No increased work of breathing  SKIN: Examination of the ears, face, neck, and abdomen was performed  - Non-inflamed firm papule on the left posterior earlobe  - Firm subcutaneous nodule on the skin. Scattered open comedones on the jawline  - Macular hyperpigmentation on abdomen  - Deferred exam of groin    All areas not commented on are within normal limits or unremarkable    (Approved Template 08/21/2020)

## 2022-06-05 MED ORDER — VICTOZA 3-PAK 0.6 MG/0.1 ML (18 MG/3 ML) SUBCUTANEOUS PEN INJECTOR
2 refills | 0 days | Status: CP
Start: 2022-06-05 — End: ?

## 2022-06-24 DIAGNOSIS — M47819 Spondylosis without myelopathy or radiculopathy, site unspecified: Principal | ICD-10-CM

## 2022-06-24 DIAGNOSIS — H209 Unspecified iridocyclitis: Principal | ICD-10-CM

## 2022-07-05 DIAGNOSIS — M47819 Spondylosis without myelopathy or radiculopathy, site unspecified: Principal | ICD-10-CM

## 2022-07-05 DIAGNOSIS — H209 Unspecified iridocyclitis: Principal | ICD-10-CM

## 2022-07-05 MED ORDER — METHOTREXATE SODIUM (CONTAINS PRESERVATIVES) 25 MG/ML INJECTION SOLUTION
SUBCUTANEOUS | 1 refills | 105 days | Status: CP
Start: 2022-07-05 — End: ?

## 2022-07-05 MED ORDER — SYRINGE WITH NEEDLE 1 ML 27 X 1/2"
SUBCUTANEOUS | 0 refills | 0 days | Status: CP
Start: 2022-07-05 — End: ?

## 2022-07-05 NOTE — Unmapped (Signed)
[facility] Specialty Medication Onboarding    Specialty Medication: Methotrexate (CONTAINS PRESERVATIVES) 25mg /mL injection solution  Prior Authorization: Not Required   Financial Assistance: No - copay  <$25  Final Copay/Day Supply: $0 / 84 days    Insurance Restrictions: None     Notes to Pharmacist: 12 syringes for $2.97    The triage team has completed the benefits investigation and has determined that the patient is able to fill this medication at [facility]. Please contact the patient to complete the onboarding or follow up with the prescribing physician as needed.

## 2022-07-05 NOTE — Unmapped (Unsigned)
[name]   Patient Onboarding/Medication Counseling    Switch from pens to vials due to insurance restrictions      Ms.Penny Gibbs is a 56 y.o. female with spondyloarthritis/uveitis who I am counseling today on initiation of therapy.  I am speaking to the patient.    Was a Nurse, learning disability used for this call? No    Verified patient's date of birth / HIPAA.    Specialty medication(s) to be sent: Inflammatory Disorders: methotrexate (injectable)      Non-specialty medications/supplies to be sent: syringes ($2.97)      Medications not needed at this time: sharps container          methotrexate injection    Patient is comfortable using vials ans she has done something similar in the past.  Reviewed that this is multi-use vial      Medication & Administration     Dosage: Inject  0.8 mL (20mg ) subcutaneously every 7 days      Lab tests required prior to treatment initiation:  ??? Pregnancy: Pregnancy status unconfirmed in patient chart but medication prescriber has indicated they are aware and wishing to initiate treatment at this time.      Administration:     Vials and syringes  1. Gather all supplies needed for injection on a clean, flat working surface: medication vial, syringe with needle, alcohol swabs, sharps container, etc.  2. Look at the medication label - look for correct medication, correct dose, and check the expiration date  3. Look at the medication - the liquid in the syringe should appear clear and yellow in color  4. Select injection site - you can use the front of your thigh or your belly (but not the area 2 inches around your belly button)  5. Prepare injection site - wash your hands and clean the skin at the injection site with an alcohol swab and let it air dry, do not touch the injection site again before the injection  6. Pop the protective cap off the top of the vial if it's the first use for that vial and wipe the rubber stopper with an alcohol wipe and allow to air dry  7. Open syringe and needle and carefully remove the needle cap, pull the plunger back to the volume you'll need to inject  8. With the methotrexate vial on a flat surface, insert the needle straight through the middle of the rubber stopper until the needle is all the way down, push the plunger until all the air is injected into the vial  9. Hold the vial and syringe and turn them upside down while keeping the needle in the vial, pull down the plunger to withdraw the desired volume of liquid from the vial, carefully remove the needle from the vial  10. Pinch the skin - with your hand not holding the syringe pinch up a fold of skin at the injection site using your forefinger and thumb  11. Insert the needle into the fold of skin at about a 45 degree angle - it's best to use a quick dart-like motion - with the needle inserted, release the pinch of skin and allow the skin to relax  12. Push the plunger down slowly as far as it will go until the syringe is empty  13. Check that the syringe is empty and carefully pull the needle out at the same angle as inserted; after the needle is removed completely from the skin  14. Dispose of the  used syringe immediately in your sharps disposal container  15. If you see any blood at the injection site, press a cotton ball or gauze on the site and maintain pressure until the bleeding stops, do not rub the injection site      Adherence/Missed dose instructions:  If your injection is given more than 2 days after your scheduled injection date - consult your pharmacist for additional instructions on how to adjust your dosing schedule.    Goals of Therapy       Rheumatoid arthritis  ??? Achieve symptom remission  ??? Slow disease progression  ??? Protection of remaining articular structures  ??? Maintenance of function  ??? Maintenance of effective psychosocial functioning  ???       Side Effects & Monitoring Parameters     ??? Upset stomach, diarrhea, nausea or throwing up  ??? Feeling dizzy, tired, or weak  ??? Hair thinning (reversible)  ??? Minor cold-like symptoms  ??? Photosensitivity - use sunscreen and avoid prolonged exposure    The following side effects should be reported to the provider:  ??? Signs of a hypersensitivity reaction - rash; hives; itching; red, swollen, blistered, or peeling skin; wheezing; tightness in the chest or throat; difficulty breathing, swallowing, or talking; swelling of the mouth, face, lips, tongue, or throat; etc.  ??? Reduced immune function - report signs of infection such as fever; chills; body aches; very bad sore throat; ear or sinus pain; cough; more sputum or change in color of sputum; pain with passing urine; wound that will not heal, etc.  Also at a slightly higher risk of some malignancies (mainly skin and blood cancers) due to this reduced immune function.  o In the case of signs of infection - the patient should hold the next dose of methotrexate and call your primary care provider to ensure adequate medical care.  Treatment may be resumed when infection is treated and patient is asymptomatic.  ??? Signs of bleeding - throwing up or coughing up blood; blood in the urine; black, red, or tarry stool; abnormal vaginal bleeding; bruises without a cause or that get bigger  ??? Signs of pancreatitis - sudden very bad stomach pain, unexplained vomiting  ??? Signs of kidney dysfunction - unable to pass urine; blood in the urine; sudden weight gain  ??? Signs of liver dysfunction - darkened urine; upset stomach; light-colored stool; yellow skin or eyes  ??? Signs of nerve problems - burning; numbness; tingling  ??? Pinpoint red spots on the skin  ??? Changes in eyesight      Contraindications, Warnings, & Precautions     ??? Have your bloodwork checked as you have been told by your prescriber  ??? Talk with your doctor if you are pregnant, planning to become pregnant, or breastfeeding - women taking this medication must use birth control while taking this drug and for some time after the last dose  ??? Discuss the possible need for holding your dose(s) of methotrexate when a planned procedure is scheduled with the prescriber as it may delay healing/recovery timeline       Drug/Food Interactions     ??? Medication list reviewed in Epic. The patient was instructed to inform the care team before taking any new medications or supplements. No drug interactions identified.     Storage, Handling Precautions, & Disposal     ??? Store intact vials at room temperature  ??? Protect from light  ??? Dispose of used syringes in a sharps disposal container  ???  50mg /8ml vials contain preservative and can be used as a multi-dose vial to be discarded 30 days after the first puncture if stored in refrigeration and protected from light  (Methotrexate Vial How Supplied/Storage and Handling Building surveyor Information - [place] )            Current Medications (including OTC/herbals), Comorbidities and Allergies     Current Outpatient Medications   Medication Sig Dispense Refill   ??? atorvastatin (LIPITOR) 40 MG tablet TAKE 1 TABLET BY MOUTH EVERY DAY 90 tablet 3   ??? clindamycin (CLEOCIN T) 1 % lotion Apply topically daily. 120 mL 11   ??? doxycycline (VIBRA-TABS) 100 MG tablet Take 1 tablet (100 mg total) by mouth Two (2) times a day. With food (okay to trial once a day) 180 tablet 1   ??? doxycycline (VIBRAMYCIN) 50 MG capsule Take 1 capsule (50 mg total) by mouth in the morning. 30 capsule 5   ??? empagliflozin (JARDIANCE) 25 mg tablet Take 1 tablet (25 mg total) by mouth daily. 90 tablet 3   ??? empty container Misc USE AS DIRECTED 1 each 2   ??? folic acid (FOLVITE) 1 MG tablet Take 1 tablet (1,000 mcg total) by mouth daily. 90 tablet 3   ??? gabapentin (NEURONTIN) 300 MG capsule Take 1 capsule (300 mg total) by mouth nightly. 30 capsule 0   ??? liraglutide (VICTOZA 3-PAK) 0.6 mg/0.1 mL (18 mg/3 mL) injection INJECT 0.6 MG ONCE DAILY FOR 1 WEEK THEN INCREASE TO 1.2 MG ONCE DAILY 9 mL 2   ??? metFORMIN (GLUCOPHAGE) 1000 MG tablet TAKE 1 TABLET BY MOUTH TWICE A DAY WITH MEALS 180 tablet 1   ??? methotrexate sodium (METHOTREXATE, CONTAINS PRESERVATIVES,) 25 mg/mL injection solution Inject 0.8 mL (20 mg total) under the skin once a week. 12 mL 1   ??? pen needle, diabetic (PEN NEEDLE) 31 gauge x 5/16 (8 mm) Ndle Injection Frequency is 1 time per day; Dx Code: Type 2 Diabetes uncontrolled (E11.65) 100 each 11   ??? syringe 1 mL 27 x 1/2 Syrg Inject 1 each under the skin once a week. Use weekly for methotrexate injection 50 each 0   ??? tretinoin (RETIN-A) 0.025 % cream Apply pea size amt to face 2-3 nights weekly.  Increase frequency up to nightly as dryness permits 45 g 4     No current facility-administered medications for this visit.       Allergies   Allergen Reactions   ??? Metaclazepam Hcl Hives   ??? Reglan [Metoclopramide Hcl] Shortness Of Breath     States it makes her feel like her throat is closing up   ??? Sumatriptan Succinate      Other reaction(s): SWELLING/EDEMA  Other reaction(s): SWELLING/EDEMA       Patient Active Problem List   Diagnosis   ??? Iron deficiency anemia   ??? Dysplasia of cervix uteri   ??? Leiomyoma of uterus   ??? Type 2 diabetes mellitus (CMS-HCC)   ??? Hx of transient ischemic attack (TIA)   ??? Malignant neoplasm of overlapping sites of right breast in female, estrogen receptor negative (CMS-HCC)   ??? Menstrual migraine   ??? Eczema   ??? Uveitis   ??? CKD stage 2 due to type 2 diabetes mellitus (CMS-HCC)   ??? Hidradenitis suppurativa   ??? Morbid (severe) obesity due to excess calories (CMS-HCC)   ??? COVID-19   ??? Postmenopausal bleeding   ??? Costochondritis   ??? Spondyloarthritis  Reviewed and up to date in Epic.    Appropriateness of Therapy     Acute infections noted within Epic:  No active infections  Patient reported infection: None    Is medication and dose appropriate based on diagnosis and infection status? Yes    Prescription has been clinically reviewed: Yes      Baseline Quality of Life Assessment       eyes get red but not painful, has not had a bag flare in a while      Financial Information     Medication Assistance provided: None Required     Anticipated copay of $0 reviewed with patient. Verified delivery address.    Delivery Information     Scheduled delivery date: 8/7    Expected start date: 8/7--was due 8/6 but will take 1 day late     Medication will be delivered via UPS to the prescription address in [facility].  This shipment will not require a signature.      Explained the services we provide at [facility] and that each month we would call to set up refills.  Stressed importance of returning phone calls so that we could ensure they receive their medications in time each month.  Informed patient that we should be setting up refills 7-10 days prior to when they will run out of medication.  A pharmacist will reach out to perform a clinical assessment periodically.  Informed patient that a welcome packet, containing information about our pharmacy and other support services, a Notice of Privacy Practices, and a drug information handout will be sent.      The patient or caregiver noted above participated in the development of this care plan and knows that they can request review of or adjustments to the care plan at any time.      Patient or caregiver verbalized understanding of the above information as well as how to contact the pharmacy at [phone] option 4 with any questions/concerns.  The pharmacy is open Monday through Friday 8:30am-4:30pm.  A pharmacist is available 24/7 via pager to answer any clinical questions they may have.    Patient Specific Needs     - Does the patient have any physical, cognitive, or cultural barriers? No    - Does the patient have adequate living arrangements? (i.e. the ability to store and take their medication appropriately) Yes    - Did you identify any home environmental safety or security hazards? No    - Patient prefers to have medications discussed with  Patient     - Is the patient or caregiver able to read and understand education materials at a high school level or above? Yes    - Patient's primary language is  English     - Is the patient high risk? No    SOCIAL DETERMINANTS OF HEALTH     At the [facility], we have learned that life circumstances - like trouble affording food, housing, utilities, or transportation can affect the health of many of our patients.   That is why we wanted to ask: are you currently experiencing any life circumstances that are negatively impacting your health and/or quality of life? No    Social Determinants of Psychologist, prison and probation services Strain: Not on file   Internet Connectivity: Not on file   Food Insecurity: Not on file   Tobacco Use: Low Risk    ??? Smoking Tobacco Use: Never   ???  Smokeless Tobacco Use: Never   ??? Passive Exposure: Not on file   Housing/Utilities: Unknown   ??? Within the past 12 months, have you ever stayed: outside, in a car, in a tent, in an overnight shelter, or temporarily in someone else's home (i.e. couch-surfing)?: No   ??? Are you worried about losing your housing?: Not on file   ??? Within the past 12 months, have you been unable to get utilities (heat, electricity) when it was really needed?: Not on file   Alcohol Use: Not on file   Transportation Needs: Not on file   Substance Use: Not on file   Health Literacy: Low Risk    ??? : Never   Physical Activity: Not on file   Interpersonal Safety: Not on file   Stress: Not on file   Intimate Partner Violence: Not on file   Depression: Not at risk   ??? PHQ-2 Score: 0   Social Connections: Not on file       Would you be willing to receive help with any of the needs that you have identified today? No       Julianne Rice  [facility] Specialty Pharmacist electricity) when it was really needed?: Not on file   Alcohol Use: Not on file   Transportation Needs: Not on file   Substance Use: Not on file   Health Literacy: Low Risk    ??? : Never   Physical Activity: Not on file   Interpersonal Safety: Not on file   Stress: Not on file   Intimate Partner Violence: Not on file   Depression: Not at risk   ??? PHQ-2 Score: 0   Social Connections: Not on file       Would you be willing to receive help with any of the needs that you have identified today? {Yes/No/Not applicable:93005}       Julianne Rice  [facility] Specialty Pharmacist

## 2022-07-05 NOTE — Unmapped (Signed)
[facility] - PHARMACIST NOTES    Rasuvo and Otrexup both denied by insurance, requiring patient to try methotrexate vials with syringes first. Attempt to reach patient but voicemail is full.  Methotrexate vials sent to the [facility] and pharmacist there will follow up with patient.     Chelsea Aus

## 2022-07-12 MED FILL — BD TUBERCULIN SYRINGE 1 ML 27 X 1/2": SUBCUTANEOUS | 84 days supply | Qty: 12 | Fill #0

## 2022-07-12 MED FILL — METHOTREXATE SODIUM (CONTAINS PRESERVATIVES) 25 MG/ML INJECTION SOLUTION: SUBCUTANEOUS | 35 days supply | Qty: 4 | Fill #0

## 2022-07-25 ENCOUNTER — Ambulatory Visit: Admit: 2022-07-25 | Discharge: 2022-07-26 | Payer: PRIVATE HEALTH INSURANCE

## 2022-07-25 DIAGNOSIS — H209 Unspecified iridocyclitis: Principal | ICD-10-CM

## 2022-07-25 DIAGNOSIS — M47819 Spondylosis without myelopathy or radiculopathy, site unspecified: Principal | ICD-10-CM

## 2022-07-25 DIAGNOSIS — Z79899 Other long term (current) drug therapy: Principal | ICD-10-CM

## 2022-07-25 LAB — CBC W/ AUTO DIFF
BASOPHILS ABSOLUTE COUNT: 0.1 10*9/L (ref 0.0–0.1)
BASOPHILS RELATIVE PERCENT: 1.1 %
EOSINOPHILS ABSOLUTE COUNT: 0.1 10*9/L (ref 0.0–0.5)
EOSINOPHILS RELATIVE PERCENT: 0.9 %
HEMATOCRIT: 37.4 % (ref 34.0–44.0)
HEMOGLOBIN: 12.7 g/dL (ref 11.3–14.9)
LYMPHOCYTES ABSOLUTE COUNT: 1.5 10*9/L (ref 1.1–3.6)
LYMPHOCYTES RELATIVE PERCENT: 26.2 %
MEAN CORPUSCULAR HEMOGLOBIN CONC: 34.1 g/dL (ref 32.0–36.0)
MEAN CORPUSCULAR HEMOGLOBIN: 28.7 pg (ref 25.9–32.4)
MEAN CORPUSCULAR VOLUME: 84.3 fL (ref 77.6–95.7)
MEAN PLATELET VOLUME: 9.2 fL (ref 6.8–10.7)
MONOCYTES ABSOLUTE COUNT: 0.3 10*9/L (ref 0.3–0.8)
MONOCYTES RELATIVE PERCENT: 5.5 %
NEUTROPHILS ABSOLUTE COUNT: 3.8 10*9/L (ref 1.8–7.8)
NEUTROPHILS RELATIVE PERCENT: 66.3 %
NUCLEATED RED BLOOD CELLS: 0 /100{WBCs} (ref <=4)
PLATELET COUNT: 247 10*9/L (ref 150–450)
RED BLOOD CELL COUNT: 4.43 10*12/L (ref 3.95–5.13)
RED CELL DISTRIBUTION WIDTH: 16.4 % — ABNORMAL HIGH (ref 12.2–15.2)
WBC ADJUSTED: 5.7 10*9/L (ref 3.6–11.2)

## 2022-07-25 LAB — BUN: BLOOD UREA NITROGEN: 15 mg/dL (ref 9–23)

## 2022-07-25 LAB — AST: AST (SGOT): 11 U/L (ref <=34)

## 2022-07-25 LAB — ALBUMIN: ALBUMIN: 4 g/dL (ref 3.4–5.0)

## 2022-07-25 LAB — CREATININE
CREATININE: 1.17 mg/dL — ABNORMAL HIGH
EGFR CKD-EPI (2021) FEMALE: 56 mL/min/{1.73_m2} — ABNORMAL LOW (ref >=60)

## 2022-07-25 LAB — ALT: ALT (SGPT): 14 U/L (ref 10–49)

## 2022-07-25 MED ORDER — FOLIC ACID 1 MG TABLET
ORAL_TABLET | Freq: Every day | ORAL | 3 refills | 90 days | Status: CP
Start: 2022-07-25 — End: ?

## 2022-07-25 MED ORDER — METHOTREXATE SODIUM (CONTAINS PRESERVATIVES) 25 MG/ML INJECTION SOLUTION
SUBCUTANEOUS | 1 refills | 105.00000 days | Status: CP
Start: 2022-07-25 — End: 2022-07-25

## 2022-07-25 NOTE — Unmapped (Deleted)
Last clinic visit: 04/25/2022     Accompanied by: Randie Heinz niece    Chief complaint: Follow-up uveitis and spondyloarthritis     History of Present Illness:     HPI:  Penny Gibbs is a 56 y.o. female  with a history of hidradenitis suppurativa, CKD stage 2, morbid obesity and breast cancer (s/p chemo/radiation end in 04/2018) here to follow-up on uveitis and spondyloarthritis. Pt first developed uveitis end of May 2019 with flare Sept 2019, initial symptoms pain and redness. Now seeing Dr. Carlynn Purl at [facility], started meloxicam Feb 2020. At first appt in Oct 2019, noted that she was HLA-B27, ANA, RF, CCP, and ANCA neg, OA noted at 1st MTPs bilat on foot XR. At that time, pain did not sound inflammatory in nature and felt more likely due to obesity and degenerative disease.    Treatment history:  - Methotrexate started 11/2019  - Pt reported mtx helped with joint pain, but wore off at the end of a week, so mtx increased to 20 mg PO Jan 2022  - MTX switched to SQ dosing in 10/2021    Events since last appt:   - Seen on 05/13/2022 by dermatology for HS, recommended continue doxycycline and clindamycin lotion.     Current Treatment:  - Methotrexate 20 mg (8 tabs) PO q week, folic acid 1 mg qday.     Interval History:  - Not yet scheduled follow-up with ophtho.     - Letter from insurance last month said they will not cover Rasuvo shots starting June 1, but would cover a different one (maybe Otrexup) instead. She will follow up and confirm name of medication.  - Her joints are occasionally stiff and achy.  - 15 minutes of morning stiffness.   - Doing well on methotrexate. Denies fevers, chills, sores, rash.  - Her vision is blurry. (When previously had uveitis presented to ED due to pain and was eventually started on methotrexate.)  - Her hands go numb while sleeping. She occasionally wakes up at night and shakes her hands to resolve numbness. Her arms also swell at night.  - Since 2 months ago, she has had L shoulder and arm swelling. The pain worsened after a fall in which she landed on her L side. Her chiropractor found nothing broken.    Objective    Physical Exam:  There were no vitals filed for this visit.  There is no height or weight on file to calculate BMI.  GENERAL: The patient is well appearing, in no acute distress. Ambulates around exam room easily and climbs on exam table without difficulty.  SKIN: No rash.   EYES: PERRL. Sclera anicteric, conjunctiva non- injected.   ENT: mucus membranes moist.   Neck: supple, no cervical lymphadenopathy  Respiratory: Breathing non-labored, CTA bilaterally  CV: Heart rate regular, no murmurs  GI: Abdomen soft, nontender, nondistended, no hepatosplenomegaly  VASCULAR: warm and well perfused extremities, no c/c/e.  NEURO: CN 2-12 grossly intact.   PSYCH: No depression or anxiety. Cooperative. Alert and oriented.   MUSCULOSKELETAL:   ?? Bilateral shoulders, elbows, wrists, hands, fingers:  No deformity, erythema, warmth, swelling, effusion, tenderness, or limited ROM.?? Able to curl all fingers. Prayer sign negative. Except: R wrist TTP, palpating L 2 3 5  MCP causes electric shooting pain up her arm.  ?? Hips without limited ROM.?? Except: External rotation with pain the the inguinal region bilaterally.  ?? Bilateral knees, ankles: No deformity, erythema, warmth, swelling, effusion, tenderness, or limited  ROM.       Assessment/Plan:     Penny Gibbs is a 56 y.o. female with a history of morbid obesity and breast cancer (s/p chemo/radiation end in 04/2018) who is being seen for follow-up of uveitis and spondyloarthritis. Pt first developed uveitis end of May 2019 with flare Sept 2019. Now seeing Dr. Carlynn Purl at [facility], started meloxicam Feb 2020. At first appt in Oct 2019, noted that she was HLA-B27, ANA, RF, CCP, and ANCA neg, OA noted at 1st MTPs bilat on foot XR. At that time, pain did not sound inflammatory in nature and felt more likely due to obesity and degenerative disease. However, subsequently developed joint pain improved with methotrexate.       1: Uveitis: Continues to be asymptomatic. Last optho appt May 2022 uveitis was quiet.   - Will continue methotrexate sq for nausea and joint pain.   - Her insurance will not cover Rasuvo shots starting June 1, but will cover ?Otrexup instead. Follow up with patient to confirm name of medication.  - Encouraged pt to call to schedule follow-up appt. Also reached out to ophtho to help arrange follow-up.      2. Spondyloarthritis: Appears in remission. Current pain seems less likely due to active arthritis.   - Continue methotrexate subcutaneous 20 mg weekly. Continue folic acid 1 mg daily.      3: High risk medication monitoring:   - Patient is currently taking methotrexate, an immunosuppressant medication that requires intensive monitoring. This monitoring was done today through history, physical, and/or lab testing.   - Will check labs today and every 3 months on stable dose of methotrexate to monitor for signs of medication toxicity.      4. Immunization Counseling:  - Influenza: Sept 2022   - Pneumococcal 23: 09/18/2020   - Prevnar PCV-20: 09/18/2021    Covid Vaccination:   Dose 1 04/20/2020  Dose 2 05/11/2020  Bivalent Booster: 09/18/2021. Discussed that she is eligible for a second COVID booster.    We discussed the above including diagnosis and recommendations, agreed on the above plan, and all questions were answered.    Follow-up: No follow-ups on file.    Danella Maiers, MD, MSCI  Assistant Professor of Medicine  Department of Medicine/Division of Rheumatology  [place] [facility]  [phone] clinic phone  [phone] clinic secure fax      I personally spent *** minutes face-to-face and non-face-to-face in the care of this patient, which includes all pre, intra, and post visit time on the date of service.  All documented time was specific to the E/M visit and does not include any procedures that may have been performed.     There are no diagnoses linked to this encounter.    ***ATTEST

## 2022-07-25 NOTE — Unmapped (Signed)
Last clinic visit: 04/25/2022     Accompanied by: alone    Chief complaint: Follow-up uveitis and spondyloarthritis     History of Present Illness:     HPI:  Penny Gibbs is a 56 y.o. female  with a history of hidradenitis suppurativa, CKD stage 2, morbid obesity and breast cancer (s/p chemo/radiation end in 04/2018) here to follow-up on uveitis and spondyloarthritis. Pt first developed uveitis end of May 2019 with flare Sept 2019, initial symptoms pain and redness. Now seeing Dr. Carlynn Purl at [facility], started meloxicam Feb 2020. At first appt in Oct 2019, noted that she was HLA-B27, ANA, RF, CCP, and ANCA neg, OA noted at 1st MTPs bilat on foot XR. At that time, pain did not sound inflammatory in nature and felt more likely due to obesity and degenerative disease.    Treatment history:  - Methotrexate started 11/2019  - Pt reported mtx helped with joint pain, but wore off at the end of a week, so mtx increased to 20 mg PO Jan 2022  - MTX switched to SQ dosing in 10/2021    Events since last appt:   - Seen on 05/13/2022 by dermatology for HS, recommended continue doxycycline and clindamycin lotion.     Current Treatment:  - Methotrexate 20 mg sq qweek, folic acid 1 mg qday.     Interval History:  - Not yet scheduled follow-up with ophtho.  - No joint pain.  - She now draws her own methotrexate injections from vial as insurance denied Rasuvo and Otrexup.  - She always has diarrhea for a day after taking methotrexate, but this is manageable. She uses OTC pill for nausea that helps.  - No coughing, SOB, fevers, chills, rashes, mouth sores.    Objective    Physical Exam:  Vitals:    07/25/22 0952   BP: 107/70   Pulse: 98   Temp: 37.1 ??C (98.8 ??F)   Weight: (!) 113.4 kg (250 lb)   Height: 162.6 cm (5' 4)     Body mass index is 42.91 kg/m??.  GENERAL: The patient is well appearing, in no acute distress. Ambulates around exam room and climbs on exam table without difficulty.  SKIN:  No rash   EYES: PERRL. Sclera anicteric conjunctiva non-injected.   ENT: mucus membranes moist without pharyngeal exudates or ulcers.   Neck: supple, no cervical lymphadenopathy, no thyromegaly  Respiratory: Breathing non-labored, CTA bilaterally   CV: Heart rate regular, no murmurs  GI: Abdomen soft, nontender, nondistended, no hepatosplenomegaly  VASCULAR: warm and well perfused extremities, no c/c/e.  NEURO: CN 2-12 grossly intact   PSYCH: No depression or anxiety. Cooperative. Alert and oriented.   MUSCULOSKELETAL:   Bilateral shoulders, elbows, wrists, hands, fingers:  No deformity, erythema, warmth, swelling, effusion, tenderness, or limited ROM.?? Grip strength good. Able to curl all fingers. Prayer sign negative.   Hips without limited ROM.??  Bilateral knees, ankles, feet, toes: No deformity, erythema, warmth, swelling, effusion, tenderness, or limited ROM. MTP squeeze negative.     Assessment/Plan:     Penny Gibbs is a 56 y.o. female with a history of morbid obesity and breast cancer (s/p chemo/radiation end in 04/2018) who is being seen for follow-up of uveitis and spondyloarthritis. Pt first developed uveitis end of May 2019 with flare Sept 2019. Now seeing Dr. Carlynn Purl at [facility], started meloxicam Feb 2020. At first appt in Oct 2019, noted that she was HLA-B27, ANA, RF, CCP, and ANCA neg, OA noted at 1st  MTPs bilat on foot XR. At that time, pain did not sound inflammatory in nature and felt more likely due to obesity and degenerative disease. However, subsequently developed joint pain improved with methotrexate.       1: Uveitis: No recent optho appt but she continues to be asymptomatic.  - Will continue methotrexate sq for nausea and joint pain. Continue folic acid 1 mg daily.   - Having diarrhea and nausea from methotrexate the day after. She treats the GI discomfort with OTC medication (unsure which one). Discussed option for Zofran and she will let us know if she would like it prescribed later.    2. Spondyloarthritis: Remains in remission without current joint pain, swelling or stiffness. Current pain seems less likely due to active arthritis.   - Continue methotrexate subcutaneous 20 mg weekly and folic acid 1 mg daily.      3: High risk medication monitoring:   - Patient is currently taking methotrexate, an immunosuppressant medication that requires intensive monitoring. This monitoring was done today through history, physical, and/or lab testing.   - Will check labs today and every 3 months on stable dose of methotrexate to monitor for signs of medication toxicity.      4. Immunization Counseling:  - Influenza: Recommend in the fall.   - Pneumococcal 23: 09/18/2020   - Prevnar PCV-20: 09/18/2021    Covid Vaccination:   Dose 1 04/20/2020  Dose 2 05/11/2020  Bivalent Booster: 09/18/2021. Discussed that she is eligible for a second COVID booster. She prefers to wait for updated Covid booster anticipated available soon.     We discussed the above including diagnosis and recommendations, agreed on the above plan, and all questions were answered.    Follow-up: Return for As already scheduled in November with Danne [place] and next May with me. Danella Maiers, MD, MSCI  Assistant Professor of Medicine  Department of Medicine/Division of Rheumatology  [facility] at [facility]  [phone] clinic phone  [phone] clinic secure fax      I personally spent 27 minutes face-to-face and non-face-to-face in the care of this patient, which includes all pre, intra, and post visit time on the date of service.  All documented time was specific to the E/M visit and does not include any procedures that may have been performed.     Diagnoses and all orders for this visit:    Spondyloarthritis    Uveitis    High risk medication use      Scribe's Attestation: Danella Maiers, MD obtained and performed the history, physical exam and medical decision making elements that were entered into the chart.  Signed by Leim Fabry, Scribe, on July 25, 2022 10:33 AM     ----------------------------------------------------------------------------------------------------------------------  July 25, 2022 10:33 AM   Documentation assistance provided by the Scribe. I was present during the time the encounter was recorded. The information recorded by the Scribe was done at my direction and has been reviewed and validated by me.  ----------------------------------------------------------------------------------------------------------------------

## 2022-07-25 NOTE — Unmapped (Deleted)
Last clinic visit: 04/25/2022     Accompanied by: alone     Chief complaint: Follow-up uveitis and spondyloarthritis     History of Present Illness:     HPI:  Penny Gibbs is a 56 y.o. female  with a history of hidradenitis suppurativa, CKD stage 2, morbid obesity and breast cancer (s/p chemo/radiation end in 04/2018) here to follow-up on uveitis and spondyloarthritis. Pt first developed uveitis end of May 2019 with flare Sept 2019, initial symptoms pain and redness. Now seeing Dr. Carlynn Purl at [facility], started meloxicam Feb 2020. At first appt in Oct 2019, noted that she was HLA-B27, ANA, RF, CCP, and ANCA neg, OA noted at 1st MTPs bilat on foot XR. At that time, pain did not sound inflammatory in nature and felt more likely due to obesity and degenerative disease.    Treatment history:  - Methotrexate started 11/2019  - Pt reported mtx helped with joint pain, but wore off at the end of a week, so mtx increased to 20 mg PO Jan 2022  - MTX switched to SQ dosing in 10/2021    Events since last appt:   - Seen on 05/13/2022 by dermatology for HS, recommended continue doxycycline and clindamycin lotion.     Current Treatment:  - Methotrexate 20 mg (8 tabs) PO q week, folic acid 1 mg qday.     Interval History:  - Not yet scheduled follow-up with ophtho.     - Letter from insurance last month said they will not cover Rasuvo shots starting June 1, but would cover a different one (maybe Otrexup) instead. She will follow up and confirm name of medication.  - Her joints are occasionally stiff and achy.  - 15 minutes of morning stiffness.   - Doing well on methotrexate. Denies fevers, chills, sores, rash.  - Her vision is blurry. (When previously had uveitis presented to ED due to pain and was eventually started on methotrexate.)  - Her hands go numb while sleeping. She occasionally wakes up at night and shakes her hands to resolve numbness. Her arms also swell at night.  - Since 2 months ago, she has had L shoulder and arm swelling. The pain worsened after a fall in which she landed on her L side. Her chiropractor found nothing broken.    Objective    Physical Exam:  Vitals:    07/25/22 0952   BP: 107/70   Pulse: 98   Temp: 37.1 ??C (98.8 ??F)   Weight: (!) 113.4 kg (250 lb)   Height: 162.6 cm (5' 4)     Body mass index is 42.91 kg/m??.  GENERAL: The patient is well appearing, in no acute distress. Ambulates around exam room easily and climbs on exam table without difficulty.  SKIN: No rash.   EYES: PERRL. Sclera anicteric, conjunctiva non- injected.   ENT: mucus membranes moist.   Neck: supple, no cervical lymphadenopathy  Respiratory: Breathing non-labored, CTA bilaterally  CV: Heart rate regular, no murmurs  GI: Abdomen soft, nontender, nondistended, no hepatosplenomegaly  VASCULAR: warm and well perfused extremities, no c/c/e.  NEURO: CN 2-12 grossly intact.   PSYCH: No depression or anxiety. Cooperative. Alert and oriented.   MUSCULOSKELETAL:   Bilateral shoulders, elbows, wrists, hands, fingers:  No deformity, erythema, warmth, swelling, effusion, tenderness, or limited ROM.?? Able to curl all fingers. Prayer sign negative. Except: R wrist TTP, palpating L 2 3 5  MCP causes electric shooting pain up her arm.  Hips without limited ROM.?? Except:  External rotation with pain the the inguinal region bilaterally.  Bilateral knees, ankles: No deformity, erythema, warmth, swelling, effusion, tenderness, or limited ROM.       Assessment/Plan:     Penny Gibbs is a 56 y.o. female with a history of morbid obesity and breast cancer (s/p chemo/radiation end in 04/2018) who is being seen for follow-up of uveitis and spondyloarthritis. Pt first developed uveitis end of May 2019 with flare Sept 2019. Now seeing Dr. Carlynn Purl at [facility], started meloxicam Feb 2020. At first appt in Oct 2019, noted that she was HLA-B27, ANA, RF, CCP, and ANCA neg, OA noted at 1st MTPs bilat on foot XR. At that time, pain did not sound inflammatory in nature and felt more likely due to obesity and degenerative disease. However, subsequently developed joint pain improved with methotrexate.       1: Uveitis: Continues to be asymptomatic. Last optho appt for uveitis May 2022 at which time note indicates uveitis was quiet.   - Will continue methotrexate sq for nausea and joint pain.   - Her insurance will not cover Rasuvo shots starting June 1, but will cover ?Otrexup instead. Follow up with patient to confirm name of medication.  - Again encouraged pt to call to schedule optho follow-up.      2. Spondyloarthritis: Appears in remission. Current pain seems less likely due to active arthritis.   - Continue methotrexate subcutaneous 20 mg weekly. Continue folic acid 1 mg daily.      3. High risk medication monitoring:   - Patient is currently taking methotrexate, an immunosuppressant medication that requires intensive monitoring. This monitoring was done today through history, physical, and/or lab testing.   - Will check labs today and every 3 months on stable dose of methotrexate to monitor for signs of medication toxicity.      4. Immunization Counseling:  - Influenza: Recommend in the fall.   - Pneumococcal 23: 09/18/2020   - Prevnar PCV-20: 09/18/2021    Covid Vaccination:   Dose 1 04/20/2020  Dose 2 05/11/2020  Bivalent Booster: 09/18/2021. Discussed that she is eligible for a second COVID booster. Also discussed new booster anticipated to be available this fall.     We discussed the above including diagnosis and recommendations, agreed on the above plan, and all questions were answered.    Follow-up: No follow-ups on file.    Danella Maiers, MD, MSCI  Assistant Professor of Medicine  Department of Medicine/Division of Rheumatology  [facility]  [phone] clinic phone  [phone] clinic secure fax      I personally spent *** minutes face-to-face and non-face-to-face in the care of this patient, which includes all pre, intra, and post visit time on the date of service.  All documented time was specific to the E/M visit and does not include any procedures that may have been performed.     Diagnoses and all orders for this visit:    Spondyloarthritis    Uveitis    High risk medication use

## 2022-07-25 NOTE — Unmapped (Signed)
Sent the pt the following message:    Results from your recent lab tests were stable.     Please let us know if you have any questions.    Dr. Sullivan Lone

## 2022-08-01 ENCOUNTER — Encounter: Payer: Self-pay | Admitting: Emergency Medicine

## 2022-08-01 ENCOUNTER — Ambulatory Visit: Payer: Managed Care, Other (non HMO)

## 2022-08-01 ENCOUNTER — Ambulatory Visit
Admission: EM | Admit: 2022-08-01 | Discharge: 2022-08-01 | Disposition: A | Discharge status: Home / Self Care | Payer: Managed Care, Other (non HMO)

## 2022-08-01 DIAGNOSIS — M79605 Pain in left leg: Secondary | ICD-10-CM

## 2022-08-01 DIAGNOSIS — M25562 Pain in left knee: Secondary | ICD-10-CM | POA: Diagnosis not present

## 2022-08-01 DIAGNOSIS — M25572 Pain in left ankle and joints of left foot: Secondary | ICD-10-CM

## 2022-08-01 MED ORDER — PREDNISONE 10 MG (21) PO TBPK
ORAL_TABLET | ORAL | 0 refills | Status: DC
Start: 2022-08-01 — End: 2023-01-26

## 2022-08-01 MED ORDER — KETOROLAC TROMETHAMINE 30 MG/ML IJ SOLN
30.0000 mg | Freq: Once | INTRAMUSCULAR | Status: AC
  Administered 2022-08-01: 30 mg via INTRAMUSCULAR

## 2022-08-01 NOTE — Discharge Instructions (Addendum)
-  Your x-rays do not show any fractures.  You have very mild arthritis in your knee. - I think your leg pain may be related to a back problem.  I have sent anti-inflammatory medication to the pharmacy for you. - You can also take extra strength Tylenol for pain. - Make sure you are stretching and try to walk and not sit for too long. - Follow-up with your primary care provider especially if you are not improving over the next week. - If you develop increased leg pain, weakness or numbness, please go to ER.

## 2022-08-01 NOTE — ED Provider Notes (Signed)
[facility]    CSN: [id number] Arrival date & time: 08/01/22  0836      History   Chief Complaint Chief Complaint  Patient presents with   Leg Pain    HPI Kristina Graham is a 56 y.o. female presenting for left knee pain with radiation down the lateral lower leg.  Patient also reports when she lifts her legs she feels pain into her left hip.  Pain worse with weightbearing and walking.  She denies any associated back pain.  She says that she started having pain yesterday and when she got in the shower she sort of slipped and twisted her leg and the pain got worse from there.  She has not taken any medication for symptoms.  She is denying any associated numbness, weakness or tingling.  She has never had similar symptoms.  She does have a history of rheumatoid arthritis and takes methotrexate.  No report of loss or bowel or bladder control.  HPI  Past Medical History:  Diagnosis Date   Breast cancer ([place])    stage 1   Dysplasia of cervix    HPV (human papilloma virus) infection    Iron deficiency anemia    Knee pain    Left   Leiomyoma of uterus    Menstrual migraine 03/04/2017   Migraine    Obesity    Uveitis    left    Patient Active Problem List   Diagnosis Date Noted   Anemia 07/23/2017   Nausea 04/14/2017   ASCUS of cervix with negative high risk HPV 03/14/2017   Hx of transient ischemic attack (TIA) 03/14/2017   Malignant neoplasm of lower-inner quadrant of right breast of female, estrogen receptor negative ([place]) 03/14/2017   Pre-diabetes 03/14/2017   Menstrual migraine 03/04/2017   Obesity 01/05/2015   Iron deficiency anemia 03/26/2013    Past Surgical History:  Procedure Laterality Date   FOOT SURGERY Bilateral 2013   HALLUX VALGUS CORRECTION      OB History   No obstetric history on file.      Home Medications    Prior to Admission medications   Medication Sig Start Date End Date Taking? Authorizing Provider  atorvastatin (LIPITOR) 40  MG tablet  11/16/19  Yes [provider]  empagliflozin (JARDIANCE) 25 MG TABS tablet Take by mouth. 04/06/21  Yes [provider]  metFORMIN (GLUCOPHAGE) 1000 MG tablet  08/28/21  Yes [provider]  clindamycin (CLEOCIN T) 1 % lotion Apply topically daily. 07/17/22   [provider]  doxycycline (VIBRAMYCIN) 50 MG capsule Take 50 mg by mouth every morning. 07/13/22   [provider]  ferrous gluconate (FERGON) 324 MG tablet Take 1 tablet (324 mg total) by mouth daily with breakfast. 03/04/17   Kathrynn Ducking, MD  folic acid (FOLVITE) 1 MG tablet Take 1 mg by mouth daily. 05/14/22   [provider]  GAVILYTE-G 236 g solution  10/23/18   [provider]  methotrexate 50 MG/2ML injection Inject into the skin. 07/12/22   [provider]  mupirocin ointment (BACTROBAN) 2 % Place into the nose. 07/19/17   [provider]  predniSONE (STERAPRED UNI-PAK 21 TAB) 10 MG (21) TBPK tablet Take 6 tablets on day 1, 5 tablets day 2, 4 tablets day 3, 3 tablets day 4, 2 tablets day 5, 1 tablet day 6 08/01/22   Laurene Footman B, PA-C  rizatriptan (MAXALT-MLT) 10 MG disintegrating tablet Take 1 tablet (10 mg total) by  mouth 3 (three) times daily as needed for migraine. May repeat in 2 hours if needed 07/09/18   Kathrynn Ducking, MD  topiramate (TOPAMAX) 25 MG tablet Take one tablet at night for one week, then take 2 tablets at night for one week, then take 3 tablets at night. 07/09/18   Kathrynn Ducking, MD  VICTOZA 18 MG/3ML SOPN Inject into the skin. 07/06/22   [provider]    Family History Family History  Problem Relation Age of Onset   Diabetes Mother    Heart disease Mother    Hypertension Mother    COPD Mother    Cancer Mother    Diabetes Sister    Diabetes Maternal Grandmother    Heart disease Maternal Grandmother    Hypertension Maternal Grandmother    Cancer Maternal Grandmother     Social History Social History    Tobacco Use   Smoking status: Never   Smokeless tobacco: Never  Vaping Use   Vaping Use: Never used  Substance Use Topics   Alcohol use: No   Drug use: No     Allergies   Metaclazepam and Sumatriptan succinate   Review of Systems Review of Systems  Musculoskeletal:  Positive for arthralgias and gait problem. Negative for back pain and joint swelling.  Skin:  Negative for color change and wound.  Neurological:  Negative for weakness and numbness.     Physical Exam Triage Vital Signs ED Triage Vitals  Enc Vitals Group     BP      Pulse      Resp      Temp      Temp src      SpO2      Weight      Height      Head Circumference      Peak Flow      Pain Score      Pain Loc      Pain Edu?      Excl. in [place]?    No data found.  Updated Vital Signs BP 103/71 (BP Location: Left Arm)   Pulse 76   Temp 98.8 F (37.1 C) (Oral)   Resp 16   LMP 07/09/2018 (Approximate)   SpO2 98%   Physical Exam Vitals and nursing note reviewed.  Constitutional:      General: She is not in acute distress.    Appearance: Normal appearance. She is not ill-appearing or toxic-appearing.  HENT:     Head: Normocephalic and atraumatic.  Eyes:     General: No scleral icterus.       Right eye: No discharge.        Left eye: No discharge.     Conjunctiva/sclera: Conjunctivae normal.  Cardiovascular:     Rate and Rhythm: Normal rate and regular rhythm.     Heart sounds: Normal heart sounds.  Pulmonary:     Effort: Pulmonary effort is normal. No respiratory distress.     Breath sounds: Normal breath sounds.  Musculoskeletal:     Cervical back: Neck supple.     Lumbar back: No tenderness. Decreased range of motion. Positive left straight leg raise test.     Left hip: Tenderness (lateral hip) present. Decreased range of motion.     Left knee: No swelling. Decreased range of motion (with flexion). Tenderness (TTP medial and lateral joint, TTP along lateral lower leg/calf) present.   Skin:    General: Skin is dry.  Neurological:  General: No focal deficit present.     Mental Status: She is alert. Mental status is at baseline.     Motor: No weakness.     Gait: Gait abnormal.  Psychiatric:        Mood and Affect: Mood normal.        Behavior: Behavior normal.        Thought Content: Thought content normal.      UC Treatments / Results  Labs (all labs ordered are listed, but only abnormal results are displayed) Labs Reviewed - No data to display  EKG   Radiology DG Knee Complete 4 Views Left  Result Date: 08/01/2022 CLINICAL DATA:  Fall and twisted left knee with knee pain and pain down to ankle EXAM: LEFT KNEE - COMPLETE 4+ VIEW; LEFT TIBIA AND FIBULA - 2 VIEW COMPARISON:  None Available. FINDINGS: Knee: There is no acute fracture or dislocation. Knee alignment is normal. There is minimal medial compartment joint space narrowing. The other joint spaces are preserved. There is no erosive change. There is minimal superior patellar enthesopathy. There is no effusion. Tibia/fibula: There is no acute fracture or dislocation. Ankle alignment appears maintained. The soft tissues are unremarkable. IMPRESSION: No acute fracture or dislocation in the knee or tibia/fibula. Electronically Signed   By: Valetta Mole M.D.   On: 08/01/2022 10:31   DG Tibia/Fibula Left  Result Date: 08/01/2022 CLINICAL DATA:  Fall and twisted left knee with knee pain and pain down to ankle EXAM: LEFT KNEE - COMPLETE 4+ VIEW; LEFT TIBIA AND FIBULA - 2 VIEW COMPARISON:  None Available. FINDINGS: Knee: There is no acute fracture or dislocation. Knee alignment is normal. There is minimal medial compartment joint space narrowing. The other joint spaces are preserved. There is no erosive change. There is minimal superior patellar enthesopathy. There is no effusion. Tibia/fibula: There is no acute fracture or dislocation. Ankle alignment appears maintained. The soft tissues are unremarkable. IMPRESSION:  No acute fracture or dislocation in the knee or tibia/fibula. Electronically Signed   By: Valetta Mole M.D.   On: 08/01/2022 10:31    Procedures Procedures (including critical care time)  Medications Ordered in UC Medications  ketorolac (TORADOL) 30 MG/ML injection 30 mg (has no administration in time range)    Initial Impression / Assessment and Plan / UC Course  I have reviewed the triage vital signs and the nursing notes.  Pertinent labs & imaging results that were available during my care of the patient were reviewed by me and considered in my medical decision making (see chart for details).   56 year old female presenting for left leg pain since yesterday.  Patient reports the pain started without injury and then caused her to twist her knee which made things worse.  She denies fall or trauma.  X-ray of knee and tib-fib.  Does not show any acute abnormality.  Since patient does have pain of her left hip.  All the way down to her left ankle and it is most all lateral, suspect this could be lumbar radiculopathy.  We will treat at this time with prednisone.  Also advised extra strength Tylenol.  She was given 30 mg IM ketorolac in clinic for acute pain relief.  Reviewed stretches.  Reviewed following up with PCP in the next week especially if symptoms or not improving.  Reviewed ED precautions.   Final Clinical Impressions(s) / UC Diagnoses   Final diagnoses:  Left leg pain     Discharge Instructions      -  Your x-rays do not show any fractures.  You have very mild arthritis in your knee. - I think your leg pain may be related to a back problem.  I have sent anti-inflammatory medication to the pharmacy for you. - You can also take extra strength Tylenol for pain. - Make sure you are stretching and try to walk and not sit for too long. - Follow-up with your primary care provider especially if you are not improving over the next week. - If you develop increased leg pain, weakness  or numbness, please go to ER.     ED Prescriptions     Medication Sig Dispense Auth. Provider   predniSONE (STERAPRED UNI-PAK 21 TAB) 10 MG (21) TBPK tablet Take 6 tablets on day 1, 5 tablets day 2, 4 tablets day 3, 3 tablets day 4, 2 tablets day 5, 1 tablet day 6 21 tablet Danton Clap, PA-C      PDMP not reviewed this encounter.   Danton Clap, PA-C 08/01/22 1100

## 2022-08-01 NOTE — ED Triage Notes (Signed)
Pt presents with left leg pain from the knee to her ankle. She slipped in the shower and twisted her leg to prevent herself from falling.

## 2022-08-16 NOTE — Unmapped (Signed)
error 

## 2022-08-26 NOTE — Unmapped (Signed)
The [facility] has made a fourth and final attempt to reach this patient to refill the following medication:methotrexate .      We have left voicemails on the following phone numbers: [phone], have been unable to leave messages on the following phone numbers: [phone], and have sent a MyChart message.    Dates contacted: 8/31, 9/5 , 9/8, 9/11  Last scheduled delivery: 07/12/22 for 35 day supply    The patient may be at risk of non-compliance with this medication. The patient should call the [facility] at [phone]  Option 4, then Option 2 (all other specialty patients) to refill medication.    Penny Gibbs   [facility] Specialty Pharmacist

## 2022-08-28 NOTE — Unmapped (Incomplete)
[facility] RHEUMATOLOGY CLINIC - PHARMACIST NOTES    [place] [facility] has been unable to reach patient about refill of methotrexate. Last scheduled delivery was on 07/12/22 for a 35 day supply. Left VM and provided [facility] contact information to schedule refill.    Waverly Ferrari  PharmD Candidate

## 2022-09-02 NOTE — Unmapped (Signed)
[facility] Clinical Assessment & Refill Coordination Note    Penny Gibbs, DOB: 03/21/1966  Phone: [phone] (home)     All above HIPAA information was verified with patient.     Was a Nurse, learning disability used for this call? No    Specialty Medication(s):   Inflammatory Disorders: methotrexate (injectable)     Current Outpatient Medications   Medication Sig Dispense Refill    atorvastatin (LIPITOR) 40 MG tablet TAKE 1 TABLET BY MOUTH EVERY DAY 90 tablet 3    clindamycin (CLEOCIN T) 1 % lotion Apply topically daily. 120 mL 11    doxycycline (VIBRAMYCIN) 50 MG capsule Take 1 capsule (50 mg total) by mouth in the morning. 30 capsule 5    empagliflozin (JARDIANCE) 25 mg tablet Take 1 tablet (25 mg total) by mouth daily. 90 tablet 3    empty container Misc USE AS DIRECTED 1 each 2    folic acid (FOLVITE) 1 MG tablet Take 1 tablet (1,000 mcg total) by mouth daily. 90 tablet 3    liraglutide (VICTOZA 3-PAK) 0.6 mg/0.1 mL (18 mg/3 mL) injection INJECT 0.6 MG ONCE DAILY FOR 1 WEEK THEN INCREASE TO 1.2 MG ONCE DAILY 9 mL 2    metFORMIN (GLUCOPHAGE) 1000 MG tablet TAKE 1 TABLET BY MOUTH TWICE A DAY WITH MEALS 180 tablet 1    methotrexate sodium (METHOTREXATE, CONTAINS PRESERVATIVES,) 25 mg/mL injection solution Inject 0.8 mL (20 mg total) under the skin once a week. 12 mL 3    pen needle, diabetic (PEN NEEDLE) 31 gauge x 5/16 (8 mm) Ndle Injection Frequency is 1 time per day; Dx Code: Type 2 Diabetes uncontrolled (E11.65) 100 each 11    syringe 1 mL 27 x 1/2 Syrg Inject 1 each under the skin once a week. Use weekly for methotrexate injection 50 each 0     No current facility-administered medications for this visit.        Changes to medications: Zelie reports no changes at this time.    Allergies   Allergen Reactions    Metaclazepam Hcl Hives    Reglan [Metoclopramide Hcl] Shortness Of Breath     States it makes her feel like her throat is closing up    Sumatriptan Succinate      Other reaction(s): SWELLING/EDEMA  Other reaction(s): SWELLING/EDEMA       Changes to allergies: No    SPECIALTY MEDICATION ADHERENCE         Medication Adherence    Patient reported X missed doses in the last month: 3  Specialty Medication: methotexat 20mg  weeks  Patient is on additional specialty medications: No  Informant: patient                            Specialty medication(s) dose(s) confirmed: Regimen is correct and unchanged.     Are there any concerns with adherence? Yes: patient has been off for 3 weeks    Adherence counseling provided? Yes: Offered Mychart Questionnaire or Opt-out, patient chose to continue via MyChart.  Reviewed that Methotrexate is specialty and we will need to speak with her (phone or questionnaire) before scheduling to confirm delivery date.  It cannot be requested via IVR.     CLINICAL MANAGEMENT AND INTERVENTION      Clinical Benefit Assessment:    Do you feel the medicine is effective or helping your condition? Yes    Clinical Benefit counseling provided? Progress note from  8/17 shows evidence of clinical benefit    Adverse Effects Assessment:    Are you experiencing any side effects? No    Are you experiencing difficulty administering your medicine? No    Quality of Life Assessment:    Quality of Life    Rheumatology  1. What impact has your specialty medication had on the reduction of your daily pain level?: Tremendous  2. What impact has your specialty medication had on your ability to complete daily tasks (prepare meals, get dressed, etc...)?Marland Kitchen Tremendous  Oncology  Dermatology  Cystic Fibrosis          How many days over the past month did your condition  keep you from your normal activities? For example, brushing your teeth or getting up in the morning. 0    Have you discussed this with your provider? Not needed    Acute Infection Status:    Acute infections noted within Epic:  No active infections  Patient reported infection: None    Therapy Appropriateness:    Is therapy appropriate and patient progressing towards therapeutic goals? Yes, therapy is appropriate and should be continued    DISEASE/MEDICATION-SPECIFIC INFORMATION      For patients on injectable medications: Patient currently has 0 doses left.  Next injection is scheduled for ASAP, 9/27.    PATIENT SPECIFIC NEEDS     Does the patient have any physical, cognitive, or cultural barriers? No      Is the patient high risk? No    Does the patient require a Care Management Plan? No     SOCIAL DETERMINANTS OF HEALTH     At the [facility], we have learned that life circumstances - like trouble affording food, housing, utilities, or transportation can affect the health of many of our patients.   That is why we wanted to ask: are you currently experiencing any life circumstances that are negatively impacting your health and/or quality of life? No    Social Determinants of Health     Financial Resource Strain: Not on file   Internet Connectivity: Not on file   Food Insecurity: Not on file   Tobacco Use: Low Risk  (07/25/2022)    Patient History     Smoking Tobacco Use: Never     Smokeless Tobacco Use: Never     Passive Exposure: Not on file   Housing/Utilities: Unknown (11/12/2021)    Housing/Utilities     Within the past 12 months, have you ever stayed: outside, in a car, in a tent, in an overnight shelter, or temporarily in someone else's home (i.e. couch-surfing)?: No     Are you worried about losing your housing?: Not on file     Within the past 12 months, have you been unable to get utilities (heat, electricity) when it was really needed?: Not on file   Alcohol Use: Not on file   Transportation Needs: Not on file   Substance Use: Not on file   Health Literacy: Low Risk  (11/12/2021)    Health Literacy     : Never   Physical Activity: Not on file   Interpersonal Safety: Not on file   Stress: Not on file   Intimate Partner Violence: Not on file   Depression: Not at risk (09/03/2021)    PHQ-2     PHQ-2 Score: 0   Social Connections: Not on file       Would you be willing to receive help with any of the needs that you  have identified today? Not applicable       SHIPPING     Specialty Medication(s) to be Shipped:   Inflammatory Disorders: methotrexate (injectable)    Other medication(s) to be shipped: No additional medications requested for fill at this time     Changes to insurance: No    Delivery Scheduled: Yes, Expected medication delivery date: 9/27.     Medication will be delivered via UPS to the confirmed prescription address in [facility].    The patient will receive a drug information handout for each medication shipped and additional FDA Medication Guides as required.  Verified that patient has previously received a Conservation officer, historic buildings and a Surveyor, mining.    The patient or caregiver noted above participated in the development of this care plan and knows that they can request review of or adjustments to the care plan at any time.      All of the patient's questions and concerns have been addressed.    Julianne Rice, PharmD   [facility] Specialty Pharmacist

## 2022-09-03 MED FILL — METHOTREXATE SODIUM (CONTAINS PRESERVATIVES) 25 MG/ML INJECTION SOLUTION: SUBCUTANEOUS | 84 days supply | Qty: 10 | Fill #0

## 2022-09-27 MED ORDER — VICTOZA 3-PAK 0.6 MG/0.1 ML (18 MG/3 ML) SUBCUTANEOUS PEN INJECTOR
2 refills | 0.00000 days
Start: 2022-09-27 — End: ?

## 2022-09-27 NOTE — Unmapped (Signed)
Patient's appointment with her pcp is Oct. 24 th at 10:40 am           Penny Gibbs

## 2022-09-27 NOTE — Unmapped (Signed)
Pt needs appt for medication refill. Thank you!

## 2022-10-08 ENCOUNTER — Ambulatory Visit: Admit: 2022-10-08 | Discharge: 2022-10-09 | Payer: PRIVATE HEALTH INSURANCE

## 2022-10-08 DIAGNOSIS — C50811 Malignant neoplasm of overlapping sites of right female breast: Principal | ICD-10-CM

## 2022-10-08 DIAGNOSIS — H209 Unspecified iridocyclitis: Principal | ICD-10-CM

## 2022-10-08 DIAGNOSIS — Z1211 Encounter for screening for malignant neoplasm of colon: Principal | ICD-10-CM

## 2022-10-08 DIAGNOSIS — N182 Chronic kidney disease, stage 2 (mild): Principal | ICD-10-CM

## 2022-10-08 DIAGNOSIS — L732 Hidradenitis suppurativa: Principal | ICD-10-CM

## 2022-10-08 DIAGNOSIS — Z171 Estrogen receptor negative status [ER-]: Principal | ICD-10-CM

## 2022-10-08 DIAGNOSIS — E1122 Type 2 diabetes mellitus with diabetic chronic kidney disease: Principal | ICD-10-CM

## 2022-10-08 LAB — LIPID PANEL
CHOLESTEROL/HDL RATIO SCREEN: 3.4 (ref 1.0–4.5)
CHOLESTEROL: 192 mg/dL (ref <=200)
HDL CHOLESTEROL: 56 mg/dL (ref 40–60)
LDL CHOLESTEROL CALCULATED: 111 mg/dL — ABNORMAL HIGH (ref 40–99)
NON-HDL CHOLESTEROL: 136 mg/dL — ABNORMAL HIGH (ref 70–130)
TRIGLYCERIDES: 123 mg/dL (ref 0–150)
VLDL CHOLESTEROL CAL: 24.6 mg/dL (ref 11–40)

## 2022-10-08 LAB — ALBUMIN / CREATININE URINE RATIO
ALBUMIN QUANT URINE: <0.3 mg/dL
CREATININE, URINE: 44.9 mg/dL

## 2022-10-08 LAB — BASIC METABOLIC PANEL
ANION GAP: 6 mmol/L (ref 5–14)
BLOOD UREA NITROGEN: 16 mg/dL (ref 9–23)
BUN / CREAT RATIO: 15
CALCIUM: 10.1 mg/dL (ref 8.7–10.4)
CHLORIDE: 107 mmol/L (ref 98–107)
CO2: 28 mmol/L (ref 20.0–31.0)
CREATININE: 1.05 mg/dL — ABNORMAL HIGH
EGFR CKD-EPI (2021) FEMALE: 63 mL/min/{1.73_m2} (ref >=60)
GLUCOSE RANDOM: 202 mg/dL — ABNORMAL HIGH (ref 70–179)
POTASSIUM: 4.4 mmol/L (ref 3.4–4.8)
SODIUM: 141 mmol/L (ref 135–145)

## 2022-10-08 MED ORDER — VICTOZA 3-PAK 0.6 MG/0.1 ML (18 MG/3 ML) SUBCUTANEOUS PEN INJECTOR
2 refills | 0 days | Status: CN
Start: 2022-10-08 — End: ?

## 2022-10-08 NOTE — Unmapped (Addendum)
Last mammo normal 04/2022. Last visit with heme/onc 04/16/2022, no evidence of metastases or recurrence, follow-up 10/22/2022.

## 2022-10-08 NOTE — Unmapped (Signed)
Lab Results   Component Value Date    CREATININE 1.17 (H) 07/25/2022     - Most likely due to T2DM.  - Repeat pending.

## 2022-10-08 NOTE — Unmapped (Signed)
Diabetic foot was done.

## 2022-10-08 NOTE — Unmapped (Addendum)
Diabetes Mellitus, type 2  Gold standards:  - HbA1c:  goal <7, lastA1C,   Lab Results   Component Value Date    A1C 7.9 (H) 09/03/2021   Worsened 7.9 -> 8.2   - BP: goal <140/90, last BP BP: 104/78   - LDL: goal < 100, last   Lab Results   Component Value Date    LDL 112 (H) 09/03/2021     - microalbumin 09/2020, repeat pending.  - Diabetic foot exam UTD.  - Retinal exam -- [place] ophtho 01/12/2021. Discussed importance of follow-up.  - Following with nutrition.   - Continue Metformin 1000 mg BID and Jardiance 25 mg once daily.  Increase Victoza to 1.8 mg once weekly.   - Continue Lipitor.  - Pnemovax and flu UTD.  - FU 4 weeks.

## 2022-10-08 NOTE — Unmapped (Signed)
Last seen by derm 05/2022, follow-up 11/12/2022.

## 2022-10-08 NOTE — Unmapped (Signed)
-   Last seen by ophtho 01/12/2021, discussed importance of follow-up appointment.  - Last visit with rheum 07/2022, follow-up 10/10/2022.

## 2022-10-08 NOTE — Unmapped (Signed)
Family Medicine Office Visit    Assessment/Plan    Penny Gibbs is a 56 y.o. female who is here for follow-up.    Problem List Items Addressed This Visit          Endocrine    Type 2 diabetes mellitus (CMS-HCC) - Primary     Diabetes Mellitus, type 2  Gold standards:  - HbA1c:  goal <7, lastA1C,   Lab Results   Component Value Date    A1C 7.9 (H) 09/03/2021   Worsened 7.9 -> 8.2   - BP: goal <140/90, last BP BP: 104/78   - LDL: goal < 100, last   Lab Results   Component Value Date    LDL 112 (H) 09/03/2021   - microalbumin 09/2020, repeat pending.  - Diabetic foot exam UTD.  - Retinal exam -- [place] ophtho 01/12/2021. Discussed importance of follow-up.  - Following with nutrition.   - Continue Metformin 1000 mg BID and Jardiance 25 mg once daily.  Increase Victoza to 1.8 mg once weekly.   - Continue Lipitor.  - Pnemovax and flu UTD.         Relevant Medications    liraglutide (VICTOZA 3-PAK) 0.6 mg/0.1 mL (18 mg/3 mL) injection (Start on 11/05/2022)    Other Relevant Orders    Albumin/creatinine urine ratio    POCT A1C - RN Obtain    CKD stage 2 due to type 2 diabetes mellitus (CMS-HCC)     Lab Results   Component Value Date    CREATININE 1.17 (H) 07/25/2022   - Most likely due to T2DM.  - Repeat pending.         Relevant Medications    liraglutide (VICTOZA 3-PAK) 0.6 mg/0.1 mL (18 mg/3 mL) injection (Start on 11/05/2022)    Other Relevant Orders    Basic Metabolic Panel       Musculoskeletal and Integument    Hidradenitis suppurativa     Last seen by derm 05/2022, follow-up 11/12/2022.            Other    Malignant neoplasm of lower-inner quadrant of right breast of female, estrogen receptor negative (CMS-HCC)     Last mammo normal 04/2022. Last visit with heme/onc 04/16/2022, no evidence of metastases or recurrence, follow-up 10/22/2022.         Uveitis     - Last seen by ophtho 01/12/2021, discussed importance of follow-up appointment.  - Last visit with rheum 07/2022, follow-up 10/10/2022.          Other Visit Diagnoses Screening for colon cancer        Relevant Orders    Colonoscopy          # Screening and Prevention  - Cervical cancer screening:  Negative co-testing 01/2018, repeat 5 years (01/2023).  - Colorectal Screening:  Colonoscopy 10/2018, repeat 5 years (2024). Ordered today.  - HIV screening:  NR 01/31/2017 (CareEverywhere)  - Hep C screening:  NR 01/31/2017 (CareEverywhere)  - Influenza: Received today.  - Tdap: Received today.  - Shingrix: Will receive #1 today. Will receive #2 upon return in 4 weeks.  - COVID:  Discussed importance of receiving updated COVID vaccine, patient will consider.    I personally spent 35 minutes face-to-face and non-face-to-face in the care of this patient, which includes all pre, intra, and post visit time on the date of service.     Subjective:    Chief Complaint   Patient presents with    Annual Exam  Discuss diabetic medication       History of Present Illness  Penny Gibbs is a 56 y.o. female who is here for follow-up.    T2DM  Last HbA1c 7.9.   Taking Metformin 1000 mg BID and Jardiance 25 mg once daily.  Started Trulicity and plan for FU in 4 weeks but did not.    Physical Exam:  Vitals Reviewed:  BP 104/78 (BP Site: L Arm, BP Position: Sitting, BP Cuff Size: Large)  - Pulse 72  - Temp 36.2 ??C (97.1 ??F) (Temporal)  - Ht 162.6 cm (5' 4) Comment: as reported by pt - Wt (!) 114.2 kg (251 lb 12.8 oz)  - BMI 43.22 kg/m??    Weight:     Wt Readings from Last 12 Encounters:   10/08/22 (!) 114.2 kg (251 lb 12.8 oz)   07/25/22 (!) 113.4 kg (250 lb)   05/08/22 (!) 115.3 kg (254 lb 4.8 oz)   04/25/22 (!) 113.9 kg (251 lb)   04/16/22 (!) 113 kg (249 lb 1.9 oz)   12/27/21 (!) 110.7 kg (244 lb)   12/21/21 (!) 109.7 kg (241 lb 14.4 oz)   12/17/21 (!) 109.8 kg (242 lb)   10/16/21 (!) 109.7 kg (241 lb 14.4 oz)   09/18/21 (!) 110.8 kg (244 lb 3.2 oz)   09/03/21 (!) 111.6 kg (246 lb)   05/02/21 (!) 114.2 kg (251 lb 12.8 oz)     General: well-nourished, no acute distress, pleasant.

## 2022-10-08 NOTE — Unmapped (Addendum)
Schedule an appointment with the eye doctor [phone]    You will come back for [place] to work on improving your diabetes.    You are due for a colonoscopy.  Colonoscopy scheduling is delayed.  They will likely not call you to schedule.  You should call them at:  [phone], option 1, then option 2 to schedule your colonoscopy.    You are due for your pap smear in February 2024.

## 2022-10-10 ENCOUNTER — Ambulatory Visit: Admit: 2022-10-10 | Discharge: 2022-10-11 | Payer: PRIVATE HEALTH INSURANCE

## 2022-10-10 DIAGNOSIS — Z79631 Methotrexate, long term, current use: Principal | ICD-10-CM

## 2022-10-10 DIAGNOSIS — M47819 Spondylosis without myelopathy or radiculopathy, site unspecified: Principal | ICD-10-CM

## 2022-10-10 DIAGNOSIS — H209 Unspecified iridocyclitis: Principal | ICD-10-CM

## 2022-10-10 DIAGNOSIS — Z79899 Other long term (current) drug therapy: Principal | ICD-10-CM

## 2022-10-10 LAB — AST: AST (SGOT): 47 U/L — ABNORMAL HIGH (ref <=34)

## 2022-10-10 LAB — ALT: ALT (SGPT): 41 U/L (ref 10–49)

## 2022-10-10 MED ORDER — FOLIC ACID 1 MG TABLET
ORAL_TABLET | Freq: Every day | ORAL | 3 refills | 90 days | Status: CP
Start: 2022-10-10 — End: ?

## 2022-10-10 MED ORDER — METHOTREXATE SODIUM 2.5 MG TABLET
ORAL_TABLET | ORAL | 1 refills | 84 days | Status: CP
Start: 2022-10-10 — End: ?

## 2022-10-10 NOTE — Unmapped (Signed)
Rheumatology return visit     REASON FOR VISIT: f/u uveitis     HISTORY: Penny Gibbs is a 56 y.o. female with hx of uveitis hidradenitis suppurativa, peripheral spondyloarthritis. HS treated by dermatology.   Additional hx of DJD which is a contributor to pain.    Additional history of breast cancer (s/p chemo/radiation, completed 04/2018).  Treatment history:  - mtx started 11/2019  - Pt reported mtx helped with joint pain, but wore off at the end of a week, so mtx increased to 20mg  12/2020  - MTX switched to SQ dosing in 10/2021.  -Methotrexate via [employer] autoinjector, but declined by insurance summer 2023, so switched back to vial and syringe.  - Current med regimen: Methotrexate 20 mg SQ weekly, folic acid 1 mg daily.    Interim history:  Presents today for f/u.     She has not yet scheduled follow-up with ophthalmology, last seen with Dr. Beckey Downing 04/2021.  Ophthalmology clinic called during patient appointment today and follow-up appointment was made.    She has been having hard time drawing up the methotrexate in vial and syringe, has difficulty drawing up the correct dose.  She was also out of methotrexate for almost a month due to difficulty filling with the pharmacy.  During that time she noted worsening joint pain particularly in ankles and knees.  She has been back on methotrexate for about 3 to 4 weeks.  Some pain today in the knees, but better than prior to resuming methotrexate.  Morning stiffness lasting 5 to 10 minutes.    She also had a flare of uveitis symptoms while out of methotrexate, but this has since resolved.    Continues follow-up with dermatology for HS, and feels this is well controlled.    No interim infection or hospitalization.        CURRENT MEDICATIONS:  Current Outpatient Medications   Medication Sig Dispense Refill    atorvastatin (LIPITOR) 40 MG tablet TAKE 1 TABLET BY MOUTH EVERY DAY 90 tablet 3    clindamycin (CLEOCIN T) 1 % lotion Apply topically daily. 120 mL 11    doxycycline (VIBRAMYCIN) 50 MG capsule Take 1 capsule (50 mg total) by mouth in the morning. 30 capsule 5    empagliflozin (JARDIANCE) 25 mg tablet Take 1 tablet (25 mg total) by mouth daily. 90 tablet 3    empty container Misc USE AS DIRECTED 1 each 2    folic acid (FOLVITE) 1 MG tablet Take 1 tablet (1,000 mcg total) by mouth daily. 90 tablet 3    [START ON 11/05/2022] liraglutide (VICTOZA 3-PAK) 0.6 mg/0.1 mL (18 mg/3 mL) injection Inject 0.3 mL (1.8 mg total) under the skin daily. 9 mL 1    metFORMIN (GLUCOPHAGE) 1000 MG tablet TAKE 1 TABLET BY MOUTH TWICE A DAY WITH MEALS 180 tablet 1    methotrexate sodium (METHOTREXATE, CONTAINS PRESERVATIVES,) 25 mg/mL injection solution Inject 0.8 mL (20 mg total) under the skin once a week. 12 mL 3    syringe 1 mL 27 x 1/2 Syrg Inject 1 each under the skin once a week. Use weekly for methotrexate injection 50 each 0     No current facility-administered medications for this visit.       Past Medical History:   Diagnosis Date    Abnormal mammogram     Anemia     Anterior uveitis     Left eye    Breast cancer in female (CMS-HCC) 03/14/2017    right  breast     Diabetes mellitus (CMS-HCC)     Prediabetes         Record Review: Available records were reviewed, including pertinent office visits, labs, and imaging.      REVIEW OF SYSTEMS: Ten system were reviewed and negative except as noted above.    PHYSICAL EXAM:  VITAL SIGNS:   Vitals:    10/10/22 0847   BP: 126/86   BP Site: L Arm   BP Position: Sitting   BP Cuff Size: Medium   Pulse: 86   Weight: (!) 112.9 kg (249 lb)   Height: 162.6 cm (5' 4)     General:   Pleasant 56 y.o.female in no acute distress, WDWN   Cardiovascular:  Regular rate and rhythm. No murmur, rub, or gallop. No lower extremity edema.    Lungs:  Clear to auscultation.Normal respiratory effort.    Musculoskeletal:   General: Ambulates w/o assistance   Hands: No swelling or tenderness. Able to make a tight fist b/l   Wrists:FROM w/o swelling or tenderness   Elbows: Tenderness over lateral epicondyle on R, some hypopigmentation over lateral epicondyle. No effusion.   Shoulders: FROM w/o pain   Knees:  FROM, crepitus on ROM   Ankles: No swelling or tenderness   Feet: no pain with MTP squeeze, no swelling or dactylitis   Psych:  Appropriate affect and mood   Skin:  No rashes.         ASSESSMENT/PLAN:  1. Uveitis and probable peripheral spondylarthritis  Switch methotrexate back to pills for ease of use.  Split dosing, 4 pills in the morning and 4 pills in the evening on Sundays to help with nausea.  Increase folic acid to 2 mg daily.  Follow-up with ophthalmology for monitoring of uveitis.  - methotrexate 2.5 MG tablet; Take 8 tablets (20 mg total) by mouth every seven (7) days. Take 4 pills in morning and 4 pills in evening on Sundays.  Dispense: 96 tablet; Refill: 1  - folic acid (FOLVITE) 1 MG tablet; Take 2 tablets (2 mg total) by mouth daily.  Dispense: 180 tablet; Refill: 3    2. Methotrexate, long term, current use  Checking labs below to evaluate for medication toxicity.  She should repeat these labs in 3 months, Future order placed with [facility] to be completed locally in 3 months.  - ALT; Future  - AST; Future  - Albumin; Future  - ALT; Future  - AST; Future  - CBC w/ Differential; Future  - Creatinine; Future  - Albumin  - ALT  - AST  - CBC w/ Differential  - Creatinine          HCM:   - Pneumonia vaccines: PPSV23 09/18/20, PCV20 09/18/21  - COVID-19 vaccine status: Pfizer 04/20/20, 05/11/20. Pfizer bivalent 09/18/21. Plans to get updated version with pharmacy.   - Annual Influenza vaccine. Status: UTD  - Bone health: not on prednisone   - Contraception: postmenopausal        Return appt 7 mo with Dr Sullivan Lone as scheduled  Greater than 30 minutes spent in visit with patient, including pre and postvisit activities.

## 2022-10-10 NOTE — Unmapped (Addendum)
Changing methotrexate back to pills. Take 4 pills in the morning and 4 pills in the evening on Sunday.   Increasing folic acid to 2 pills daily to help with nausea.     Go in Feb to a [facility] and complete labs.

## 2022-10-14 DIAGNOSIS — Z79631 Methotrexate, long term, current use: Principal | ICD-10-CM

## 2022-10-16 ENCOUNTER — Ambulatory Visit: Admit: 2022-10-16 | Discharge: 2022-10-17 | Payer: PRIVATE HEALTH INSURANCE

## 2022-10-17 NOTE — Unmapped (Unsigned)
[facility]    Timeline Summary    #anterior uveitis both eyes     Right    Left        Date [facility] Findings Tx [facility]  IOP Findings Tx  Sys/Misc    March 18, 1966         DOB    05/13/2018 20/20- 15 quiet  20/25 13 2+ cell, 1+flare pred q2h, cyclo PO pred 40 mg    05/20/2018 20/25 11 quiet  20/30 12 quiet Taper pred, stop cyclo     08/21/2018 20/20- 11 quiet  20/25 18 2+ cell/flare, hypopyon, post synechiae pred q1h, cyclo Neg RPR, quant gold, ACE, HLA B27, RF, ANCA neg, RF, neg    08/24/2018 20/20- 12 quiet  20/25 12 2+ cell, fibrin/hypopyon inf pred q1h, cyclo     09/03/2018 20/25 12 quiet  20/25 14 Trace cell, fibrin Taper pred, continue cyclo    Dahlia Bailiff 01/2019         Start meloxicam    08/23/2019        AC tap OS Neg HSV, VZV, CMV    10/05/2019 20/20 13 quiet  20/20 15 1+cell/flare, small hypopyon Cont pred forte q3h,  Continue mobicm, continue MTX, consider increase    11/24/2019 20/20 16 quiet  20/25- 17 2-3+ AC  PF TID start MTX   gilbert 01/02/2021         inc MTX 20    KLW 01/12/2021  20/40 12  quiet  20/40 15  quiet  MTX 20    04/13/2021  20/20 12   20/40 17   MTX 20    10/16/2022 20/20 11 quiet  20/25 17 quiet                                                  Initial History of present illness:     20F supervisor of foster care, here to establish care.  She was previously followed by uveitis specialist Dr. Carlynn Purl at [facility] but now is establishing care at [facility] which is helpful as her rheumatologist is here.    She has a history of breast cancer status post chemotherapy and radiation in 2018.    The manifestations of her uveitis were initially pain and redness and she had a hypopyon.  Comprehensive work-up was done and was unrevealing including a anterior chamber tap in September 2020 which was negative for HSV, VZV, CMV.    A detailed questionnaire regarding medical history and review of symptoms relevant to uveitis was filled out by the patient, reviewed by the ophthalmologist and scanned into the electronic medical record. It is notable for her throat arthritis, history of cancer, history of diabetes.  History of blood transfusions with history of anemia.    Assessment and plan:     # Anterior Uveitis, left eye   - Hx of uveitis treated in 05/2018 with topical steroids and cycloplegic,   - Pt endorses joint pain / swelling / stiffness since May 2019        fu today, doing well just on MTX      Follow-up 6 months, OCT      #Arthritis  Followed by Dr. Sullivan Lone,   initial impression was could be related to obesity given negative serological work-up but symptoms have improved with IMT  Copy to :   Referring provider   Primary Care Provider Amada Kingfisher, MD

## 2022-10-22 ENCOUNTER — Ambulatory Visit: Admit: 2022-10-22 | Discharge: 2022-10-23 | Payer: PRIVATE HEALTH INSURANCE | Attending: Family | Primary: Family

## 2022-10-22 DIAGNOSIS — C50311 Malignant neoplasm of lower-inner quadrant of right female breast: Principal | ICD-10-CM

## 2022-10-22 DIAGNOSIS — Z171 Estrogen receptor negative status [ER-]: Principal | ICD-10-CM

## 2022-10-22 NOTE — Unmapped (Unsigned)
Patient Name: Penny Gibbs  Patient Age: 56 y.o.  Encounter Date: 10/22/2022     Referring Physician: Marcy Panning, Fnp  [address]  [address]  [place],  [place] [address].     PCP: Amada Kingfisher, MD    Surgical oncology Dr. Dellis Anes  Medical oncology Dr. Garlon Hatchet  Radiation oncology Dr. Chales Abrahams    Reason for Visit: A 56 y.o. female with  breast cancer for reassessment and further management right-sided ER and PR neg/HER2 positive  breast cancer (diagnosed in  April 2018, received [facility]). Currently NED.     Assessment: See detailed oncology history below.     Plan: The following issues were discussed:  - Goals of therapy: Curative.  - Current status: no evidence of metastases or recurrence  - Breast imaging: Bilateral mammography [facility] 04/2022 BI-RADS 2 and suggest repeat in 1 year.  - Night sweats: minimal on today's visit  - [place] syndrome: Genetic disease on her father's side, he was one of 13 siblings all 13 deceased from [place]. Previously scheduled for genetic counseling and testing, missed appointment and is currently ambivalent about getting tested, but may pursue it in future. Currently with no symptoms. She has 1 sister who is also asymptomatic and has not been tested.  No symptoms suggestive of syndrome.   - Rheumatoid arthritis: Has uveitis and managed at ophthalmology on [facility] and currently on weekly low dose methotrexate.  No major visual loss: recommend that she have liver function tests done periodically given protracted use of low dose methotrexate.   - Exercise and weight: Managed by PCP.  Patient has diabetes and BMI substantially elevated   - Follow-up: in 1 year with [facility]    Interim History:   --Doing well overall.  --No symptoms suggesting recurrent breast cancer.   --Grade 2 symptom is several hot flashes per day.  --Grade 1 symptoms include mild fatigue, 5 to 7 pound weight gain, some numbness and tingling in extremities probably -Doing well overall.  -No symptoms suggesting recurrent breast cancer.   -Only grade 2 symptom is several hot flashes per day.  Grade 1 symptoms include mild fatigue, 5 to 7 pound weight gain, some numbness and tingling in extremities probably residual of chemotherapy, some intermittent swelling of her extremities, and some occasional headaches.  She also has increase in frequency of urination at night up to twice normal.  --LFT are elevated, going back to [facility] in 2 weeks.  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  I told her that she needs to think about what she will do after retirement as she is a young woman.  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  Living with sister.  States occasionally walks about 30 minutes a day and working on weight control but without great success.    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 Still lives with sister (who had stroke in her 73's last year as well as COVID). Mother died in 02/15/2021of congestive heart failure. Excellent quality of life currently.     -Performance status 0.  .Personal and Social History: Patient is single and lives  with her mother and her sister. She is a Merchandiser, retail of social services, wears mask. Marland Kitchen No children. No pets. Returned  to work September 6.2019     Oncology History Overview Note   Right breast cancer       Malignant neoplasm of lower-inner quadrant of right breast of female, estrogen receptor negative (CMS-HCC)   02/25/2017 -  Presenting Symptoms    Screening detected B/L MMG:  FINDINGS: 2 new less than 2 cm oval circumscribed dense masses in the posterior depth LIQ  right breast. Other stable subcentimeter asymmetries are present in both breasts. No architectural distortion or malignant calcifications in either breast.     02/25/2017 Interval Scan(s)    Diagnostic Right MMG/US:  MMG: Right breast ILQ 1.1 cm adjacent masses do not efface with compression views.      [place]: At the 4:00 position 10 cm from the nipple, bi-lobed parallel mass/or 2 adjacent masses with some indistinct margins, hypoechoic echotexture, and enhanced through transmission measuring 2.3 x 1.3 x 1.2 cm.  Evaluation of the right axilla demonstrated multiple lymph nodes with prominent cortices that appear similar compared to prior mammograms dating back to 2012.     03/11/2017 Biopsy    Right breast CNBx w/clip placement:   A. 4 o'clock, 10 cm from nipple, core needle biopsy  - Invasive ductal carcinoma   - [place] combined histologic grade: 3  - ER Negative, PR Negative, Her2/neu+ (+3) per [facility]     03/19/2017 Tumor Board    R cT2 N0 IDC, G3, -/-/+. Stable bilateral adenopathy present on imaging. Excellent BCT candidate with or without NACT. Discussed surg 1st approach to help direct medical therapy. Meeting surgery, med/onc, rad/onc today.     03/21/2017 Surgery    Right partial mastectomy and sentinel node biopsy by Dr. Dellis Anes: Multifocal right sided breast cancer. Largest lesion 23 mm and 2 smaller lesions one 1 mm, and a second 0.2 mm. One sentinel lymph node negative. T2 N0 M0     04/09/2017 - 07/23/2017 Chemotherapy    Started [facility]P echocardiogram WNL.      04/18/2017 Adverse Reaction    [place] [facility] ER visit=sob, abd pain, diarrhea, hematochezia and UTI.  CXR showed port migration to azygos vein--new port placed 04/29/17.     --C2D1 04/30/17-only given TCH--Perjeta held.  ADDENDUM only received Perjeta for C1--held for the remainder of cycles d/t diarrhea and ER visits.       05/31/2017 Adverse Reaction    [facility] [place] visit-persistent N/V dehydration with AKI Cr=154, required 1L IVF     07/23/2017 Adverse Reaction    Hgb=7.6 required 2 units PRBCs     08/18/2017 - 10/02/2017 Radiation      Treatment site Treatment Technique/  Modality Energy Dose per fraction Total number  of fractions Total dose Start date End date   Right whole breast Tangents with Field in [place] Mixed 6 and 15 MV/MeV 200 cGy 25 5000 cGy 08/18/2017    09/25/2017   Right breast boost [place] Electrons 15 MV/MeV 200 cGy 5 1000 cGy 09/26/2017 10/02/2017          11/25/2017 -  Immunotherapy     On-going herceptin-complete by 04/2018          Past Medical History:   Diagnosis Date    Abnormal mammogram     Anemia     Anterior uveitis     Left eye    Breast cancer in female (CMS-HCC) 03/14/2017    right breast  Diabetes mellitus (CMS-HCC)     Prediabetes          Current Outpatient Medications:     atorvastatin (LIPITOR) 40 MG tablet, TAKE 1 TABLET BY MOUTH EVERY DAY, Disp: 90 tablet, Rfl: 3    clindamycin (CLEOCIN T) 1 % lotion, Apply topically daily., Disp: 120 mL, Rfl: 11    doxycycline (VIBRAMYCIN) 50 MG capsule, Take 1 capsule (50 mg total) by mouth in the morning., Disp: 30 capsule, Rfl: 5    empagliflozin (JARDIANCE) 25 mg tablet, Take 1 tablet (25 mg total) by mouth daily., Disp: 90 tablet, Rfl: 3    empty container Misc, USE AS DIRECTED, Disp: 1 each, Rfl: 2    folic acid (FOLVITE) 1 MG tablet, Take 2 tablets (2 mg total) by mouth daily., Disp: 180 tablet, Rfl: 3    [START ON 11/05/2022] liraglutide (VICTOZA 3-PAK) 0.6 mg/0.1 mL (18 mg/3 mL) injection, Inject 0.3 mL (1.8 mg total) under the skin daily., Disp: 9 mL, Rfl: 1    metFORMIN (GLUCOPHAGE) 1000 MG tablet, TAKE 1 TABLET BY MOUTH TWICE A DAY WITH MEALS, Disp: 180 tablet, Rfl: 1    methotrexate 2.5 MG tablet, Take 8 tablets (20 mg total) by mouth every seven (7) days. Take 4 pills in morning and 4 pills in evening on Sundays., Disp: 96 tablet, Rfl: 1    Review of Systems: A complete review of systems was obtained including: Constitutional, Eyes, ENT, Cardiovascular, Respiratory, GI, GU, Musculoskeletal, Skin, Neurological, Psychiatric, Endocrine, Heme/Lymphatic, and Allergic/Immunologic systems. It is negative or non-contributory to the patient???s management except for the following: None    Physical Examination:  Vital Signs: BP 109/74  - Pulse 95  - Temp 36.8 ??C (98.2 ??F) (Temporal)  - Resp 17  - Wt (!) 113.3 kg (249 lb 12.5 oz)  - SpO2 100%  - BMI 42.87 kg/m??   Wt Readings from Last 6 Encounters:   10/22/22 (!) 113.3 kg (249 lb 12.5 oz)   10/10/22 (!) 112.9 kg (249 lb)   10/08/22 (!) 114.2 kg (251 lb 12.8 oz)   07/25/22 (!) 113.4 kg (250 lb)   05/08/22 (!) 115.3 kg (254 lb 4.8 oz)   04/25/22 (!) 113.9 kg (251 lb)   General: Well appearing woman in no acute distress. Healthy-appearing female in no acute distress..  Cardiovascular: S1 S2 with no murmurs or gallops appreciated, heart sounds somewhat muffled from habitus.  Respiratory: Lung fields clear bilaterally at bases and throughout.   Gastrointestinal:  Abdomen round, soft, non-tender with no appreciable masses or hepatosplenomegaly.   Musculoskeletal:  No bony pain or tenderness.   Psychiatric:  Alert and oriented. Affect appropriate, and interactive. .   Heme/Lymphatic/Immunologic:  No cervical or axillary adenopathy.   Upper Extremity Lymphedema: None.   Breast and regional nodes: Well healed lumpectomy on right breast with sentinel node biopsy scars, no masses, edema or evidence of recurrence.  Left breast exam normal.       Portions of this note were created using Dragon voice recognition software. Minor spelling, syntax errors, grammatical content or punctuation errors may have occurred unintentionally. Please notify Thereasa Parkin if changes are necessary. While every attempt has been made to minimize errors, some may still be present.  - I have reviewed all relevant  communications and/or test results from external MD/APP, facility, or healthcare organizations.

## 2022-11-04 NOTE — Unmapped (Addendum)
Diabetes Mellitus, type 2  Gold standards:  - HbA1c:  goal <7, lastA1C,   Lab Results   Component Value Date    A1C 7.1 (H) 11/06/2022   Congratulated on improvement 7.9 -> 8.2  -> 7.1  - BP: goal <140/90, last BP BP: 116/87 (average bp)   - LDL: goal < 100, last   Lab Results   Component Value Date    LDL 111 (H) 10/08/2022     - microalbumin 09/2022.  - Diabetic foot exam UTD.  - Retinal exam -- [place] ophtho 10/2022.  - Following with nutrition.   - Continue Metformin 1000 mg BID, Jardiance 25 mg once daily, Victoza 1.8 mg daily.  - Victoza no longer covered by insurance after 12/31, will switch to Ozempic 0.5 mg once weekly.  - Continue Lipitor.  - Pnemovax and flu UTD.  - FU 8 weeks for recheck.

## 2022-11-04 NOTE — Unmapped (Signed)
Family Medicine Office Visit    Assessment/Plan    Penny Gibbs is a 56 y.o. female who is here for follow-up.    Problem List Items Addressed This Visit          Endocrine    Type 2 diabetes mellitus (CMS-HCC) - Primary     Diabetes Mellitus, type 2  Gold standards:  - HbA1c:  goal <7, lastA1C,   Lab Results   Component Value Date    A1C 7.1 (H) 11/06/2022   Congratulated on improvement 7.9 -> 8.2  -> 7.1  - BP: goal <140/90, last BP BP: 116/87 (average bp)   - LDL: goal < 100, last   Lab Results   Component Value Date    LDL 111 (H) 10/08/2022   - microalbumin 09/2022.  - Diabetic foot exam UTD.  - Retinal exam -- [place] ophtho 10/2022.  - Following with nutrition.   - Continue Metformin 1000 mg BID, Jardiance 25 mg once daily, Victoza 1.8 mg daily.  - Victoza no longer covered by insurance after 12/31, will switch to Ozempic 0.5 mg once weekly.  - Continue Lipitor.  - Pnemovax and flu UTD.  - FU 8 weeks for recheck.         Relevant Medications    semaglutide (OZEMPIC) 0.25 mg or 0.5 mg (2 mg/3 mL) PnIj     Other Visit Diagnoses       Screening for colon cancer        Relevant Orders    Colonoscopy            # Screening and Prevention  - Cervical cancer screening:  Negative co-testing 01/2018, repeat 5 years (01/2023).Scheduled follow-up today.  - Colorectal Screening:  Colonoscopy 10/2018, repeat 5 years (2024). Ordered today.  - HIV screening:  NR 01/31/2017 ([facility])  - Hep C screening:  NR 01/31/2017 ([facility])  - Influenza: Received today.  - Tdap: Received today.  - Shingrix: Will receive #1 today. Will receive #2 upon return in 4 weeks.  - COVID:  Discussed importance of receiving updated COVID vaccine, patient will consider.    I personally spent 35 minutes face-to-face and non-face-to-face in the care of this patient, which includes all pre, intra, and post visit time on the date of service.     Subjective:    Chief Complaint   Patient presents with    Diabetes       History of Present Illness  Penny Gibbs is a 56 y.o. female who is here for follow-up.    T2DM  Last HbA1c 8.2.  Taking Metformin 1000 mg BID and Jardiance 25 mg once daily.  At last visit 1 month ago, increased Victoza to 1.8 mg once weekly.  No side effects from medication.    Physical Exam:  Vitals Reviewed:  BP 116/87 Comment: average bp - Pulse 76  - Temp 36.2 ??C (97.2 ??F) (Temporal)  - Wt (!) 113.3 kg (249 lb 12.8 oz)  - BMI 42.88 kg/m??    Weight:     Wt Readings from Last 12 Encounters:   11/06/22 (!) 113.3 kg (249 lb 12.8 oz)   10/22/22 (!) 113.3 kg (249 lb 12.5 oz)   10/10/22 (!) 112.9 kg (249 lb)   10/08/22 (!) 114.2 kg (251 lb 12.8 oz)   07/25/22 (!) 113.4 kg (250 lb)   05/08/22 (!) 115.3 kg (254 lb 4.8 oz)   04/25/22 (!) 113.9 kg (251 lb)   04/16/22 (!) 113 kg (  249 lb 1.9 oz)   12/27/21 (!) 110.7 kg (244 lb)   12/21/21 (!) 109.7 kg (241 lb 14.4 oz)   12/17/21 (!) 109.8 kg (242 lb)   10/16/21 (!) 109.7 kg (241 lb 14.4 oz)     General: well-nourished, no acute distress, pleasant.

## 2022-11-05 MED ORDER — VICTOZA 3-PAK 0.6 MG/0.1 ML (18 MG/3 ML) SUBCUTANEOUS PEN INJECTOR
Freq: Every day | SUBCUTANEOUS | 1 refills | 30 days | Status: CP
Start: 2022-11-05 — End: 2023-11-05

## 2022-11-06 ENCOUNTER — Ambulatory Visit: Admit: 2022-11-06 | Discharge: 2022-11-07 | Payer: PRIVATE HEALTH INSURANCE

## 2022-11-06 DIAGNOSIS — N182 Chronic kidney disease, stage 2 (mild): Principal | ICD-10-CM

## 2022-11-06 DIAGNOSIS — Z1211 Encounter for screening for malignant neoplasm of colon: Principal | ICD-10-CM

## 2022-11-06 DIAGNOSIS — E1122 Type 2 diabetes mellitus with diabetic chronic kidney disease: Principal | ICD-10-CM

## 2022-11-06 MED ORDER — OZEMPIC 0.25 MG OR 0.5 MG (2 MG/3 ML) SUBCUTANEOUS PEN INJECTOR
SUBCUTANEOUS | 3 refills | 84 days | Status: CP
Start: 2022-11-06 — End: ?

## 2022-11-06 NOTE — Unmapped (Signed)
Colonoscopy scheduling is delayed.  They will likely not call you to schedule.  You should call them at:  [phone], option 1, then option 2 to schedule your colonoscopy.

## 2022-11-12 ENCOUNTER — Ambulatory Visit: Admit: 2022-11-12 | Discharge: 2022-11-13 | Payer: PRIVATE HEALTH INSURANCE

## 2022-11-12 DIAGNOSIS — L700 Acne vulgaris: Principal | ICD-10-CM

## 2022-11-12 DIAGNOSIS — L732 Hidradenitis suppurativa: Principal | ICD-10-CM

## 2022-11-12 DIAGNOSIS — L739 Follicular disorder, unspecified: Principal | ICD-10-CM

## 2022-11-12 DIAGNOSIS — L309 Dermatitis, unspecified: Principal | ICD-10-CM

## 2022-11-12 MED ORDER — TACROLIMUS 0.1 % TOPICAL OINTMENT
Freq: Two times a day (BID) | TOPICAL | 2 refills | 100.00000 days | Status: CP
Start: 2022-11-12 — End: 2023-11-12

## 2022-11-12 MED ORDER — TRETINOIN 0.025 % TOPICAL CREAM
Freq: Every evening | TOPICAL | 8 refills | 40.00000 days | Status: CP
Start: 2022-11-12 — End: 2023-11-12

## 2022-11-12 MED ORDER — DOXYCYCLINE HYCLATE 100 MG CAPSULE
ORAL_CAPSULE | Freq: Every day | ORAL | 5 refills | 30.00000 days | Status: CP
Start: 2022-11-12 — End: 2023-05-11

## 2022-11-12 MED ORDER — CLINDAMYCIN 1 % LOTION
Freq: Every day | TOPICAL | 11 refills | 0.00000 days | Status: CP
Start: 2022-11-12 — End: 2023-11-12

## 2022-11-12 NOTE — Unmapped (Signed)
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  - for the back: start BPO wash daily in the shower and areas that develop HS and pustules  - continue clindamycin lotion daily    Hidradenitis suppurativa, likely Doreene Adas I (patient deferred exam), chronic, recurrent, not at patient goal  discussed etiology, natural history, prognosis of condition  - given partial improvement, increase doxycycline (VIBRAMYCIN) 100 MG capsule; Take 1 capsule (100 mg total) by mouth in the morning.  - continue clindamycin (CLEOCIN T) 1 % lotion; Apply topically daily.  - BPO wash as above    Dermatitis, back  Hyperpigmented scaly macules on back raise concern for eczematous dermatitis. Avoiding topical steroid given concomitant acne in the affected area  - start tacrolimus (PROTOPIC) 0.1 % ointment; Apply 1 Application topically two (2) times a day. Apply to itchy scaly areas on back    The patient was advised to call for an appointment should any new, changing, or symptomatic lesions develop.     RTC: Return in about 2 months (around 01/13/2023). or sooner as needed   _________________________________________________________________      Chief Complaint     Chief Complaint   Patient presents with    HS     Pt here for 35m  follow up HS        HPI     Penny Gibbs is a 56 y.o. female who presents as a returning patient (last seen 05/13/2022) to Dermatology for follow up of HS and acne. HS has been overall doing well with doxycycline. Takes it in the morning with breakfast. Breakfast can be a biscuit or cereal. Patient is unsure if clindamycin is helpful since it is difficult to apply multiple times per day. Has occasional HS flares on the medial and posterior thighs. Has occasional acne flares on the jaw line. Acne is doing well today but patient still has active areas. Acne has been worst on the upper back; it is very itchy. No history of kidney or heart disease. No history of hypertension    The patient denies any other new or changing lesions or areas of concern.     Pertinent Past Medical History       Problem List          Musculoskeletal and Integument    Eczema    Relevant Medications    tretinoin (RETIN-A) 0.025 % cream    tacrolimus (PROTOPIC) 0.1 % ointment    Hidradenitis suppurativa - Primary    Relevant Medications    doxycycline (VIBRAMYCIN) 100 MG capsule    clindamycin (CLEOCIN T) 1 % lotion    tretinoin (RETIN-A) 0.025 % cream     Past Medical History, Family History, Social History, Medication List, Allergies, and Problem List were reviewed in the rooming section of Epic.     ROS: Other than symptoms mentioned in the HPI, no fevers, chills, or other skin complaints  Physical Examination     GENERAL: Well-appearing female in no acute distress, resting comfortably.  NEURO: Alert and oriented, answers questions appropriately  SKIN (Focal Skin Exam): Per patient request, examination of face, upper back (patient defers exam of groin) was performed    Two inflamed nodules on right and left jaw line  Numerous scattered follicular pustules and inflamed papules on upper back  Numerous scattered scaly hyperpigmented patches on upper and spinal back    All areas not commented on are within normal limits or unremarkable      (Approved Template 08/21/2020)

## 2022-11-12 NOTE — Unmapped (Addendum)
Please apply a benzoyl peroxide wash 10% on your back in the shower. Can bleach linens (clothes, towels, etc), will NOT bleach your skin or hair). Dry off with a white towel so it doesn't take the color out of clothing and colored towels.

## 2022-11-13 NOTE — Unmapped (Signed)
Specialty Medication(s): Methotrexate solution for Injection    Ms.Bergthold has been dis-enrolled from the [facility] specialty pharmacy services due to a change in therapy. The patient is now taking Methotrexate oral tablets and is not filling at the [facility].    Additional information provided to the patient: N/A    Sherral Hammers, PharmD  [facility] Specialty Pharmacist

## 2022-11-18 DIAGNOSIS — L738 Other specified follicular disorders: Principal | ICD-10-CM

## 2022-11-18 MED ORDER — FLUCONAZOLE 100 MG TABLET
ORAL_TABLET | Freq: Once | ORAL | 0 refills | 1.00000 days | Status: CN
Start: 2022-11-18 — End: 2022-11-18

## 2022-11-18 MED ORDER — KETOCONAZOLE 2 % SHAMPOO
TOPICAL | 11 refills | 0.00000 days | Status: CP
Start: 2022-11-18 — End: 2022-12-18

## 2023-01-26 ENCOUNTER — Telehealth: Payer: Managed Care, Other (non HMO) | Admitting: Family

## 2023-01-26 DIAGNOSIS — J019 Acute sinusitis, unspecified: Secondary | ICD-10-CM

## 2023-01-26 DIAGNOSIS — Z8616 Personal history of COVID-19: Secondary | ICD-10-CM

## 2023-01-26 MED ORDER — AMOXICILLIN-POT CLAVULANATE 875-125 MG PO TABS: 1.0000 | ORAL_TABLET | Freq: Two times a day (BID) | ORAL | 0 refills | Status: DC

## 2023-01-26 NOTE — Patient Instructions (Signed)

## 2023-01-26 NOTE — Progress Notes (Signed)
Virtual Visit Consent   [name], you are scheduled for a virtual visit with a [facility] provider today. Just as with appointments in the office, your consent must be obtained to participate. Your consent will be active for this visit and any virtual visit you may have with one of our providers in the next 365 days. If you have a MyChart account, a copy of this consent can be sent to you electronically.  As this is a virtual visit, video technology does not allow for your provider to perform a traditional examination. This may limit your provider's ability to fully assess your condition. If your provider identifies any concerns that need to be evaluated in person or the need to arrange testing (such as labs, EKG, etc.), we will make arrangements to do so. Although advances in technology are sophisticated, we cannot ensure that it will always work on either your end or our end. If the connection with a video visit is poor, the visit may have to be switched to a telephone visit. With either a video or telephone visit, we are not always able to ensure that we have a secure connection.  By engaging in this virtual visit, you consent to the provision of healthcare and authorize for your insurance to be billed (if applicable) for the services provided during this visit. Depending on your insurance coverage, you may receive a charge related to this service.  I need to obtain your verbal consent now. Are you willing to proceed with your visit today? ANALILIA RUDZIK has provided verbal consent on 01/26/2023 for a virtual visit (video or telephone). Evelina Dun, FNP  Date: 01/26/2023 6:28 PM  Virtual Visit via Video Note   I, Evelina Dun, connected with  RIELY SKORA  ([id number], 04-12-1966) on 01/26/23 at  6:30 PM EST by a video-enabled telemedicine application and verified that I am speaking with the correct person using two identifiers.  Location: Patient: Virtual Visit Location Patient:  Home Provider: Virtual Visit Location Provider: Home Office   I discussed the limitations of evaluation and management by telemedicine and the availability of in person appointments. The patient expressed understanding and agreed to proceed.    History of Present Illness: Kristina Graham is a 57 y.o. who identifies as a female who was assigned female at birth, and is being seen today for COVID two weeks ago. She tested positive 01/06/23.   HPI: URI  This is a new problem. The current episode started 1 to 4 weeks ago. The problem has been gradually worsening. Associated symptoms include congestion, coughing, ear pain, headaches, rhinorrhea, sinus pain and a sore throat. Associated symptoms comments: chills. She has tried acetaminophen for the symptoms. The treatment provided mild relief.    Problems:  Patient Active Problem List   Diagnosis Date Noted   Anemia 07/23/2017   Nausea 04/14/2017   ASCUS of cervix with negative high risk HPV 03/14/2017   Hx of transient ischemic attack (TIA) 03/14/2017   Malignant neoplasm of lower-inner quadrant of right breast of female, estrogen receptor negative ([facility]) 03/14/2017   Pre-diabetes 03/14/2017   Menstrual migraine 03/04/2017   Obesity 01/05/2015   Iron deficiency anemia 03/26/2013    Allergies:  Allergies  Allergen Reactions   Metaclazepam Hives   Sumatriptan Succinate     Other reaction(s): SWELLING/EDEMA   Medications:  Current Outpatient Medications:    amoxicillin-clavulanate (AUGMENTIN) 875-125 MG tablet, Take 1 tablet by mouth 2 (two) times daily., Disp: 14 tablet, Rfl:  0   atorvastatin (LIPITOR) 40 MG tablet, , Disp: , Rfl:    clindamycin (CLEOCIN T) 1 % lotion, Apply topically daily., Disp: , Rfl:    doxycycline (VIBRAMYCIN) 50 MG capsule, Take 50 mg by mouth every morning., Disp: , Rfl:    empagliflozin (JARDIANCE) 25 MG TABS tablet, Take by mouth., Disp: , Rfl:    ferrous gluconate (FERGON) 324 MG tablet, Take 1 tablet (324 mg  total) by mouth daily with breakfast., Disp: 30 tablet, Rfl: 3   folic acid (FOLVITE) 1 MG tablet, Take 1 mg by mouth daily., Disp: , Rfl:    GAVILYTE-G 236 g solution, , Disp: , Rfl:    metFORMIN (GLUCOPHAGE) 1000 MG tablet, , Disp: , Rfl:    methotrexate 50 MG/2ML injection, Inject into the skin., Disp: , Rfl:    rizatriptan (MAXALT-MLT) 10 MG disintegrating tablet, Take 1 tablet (10 mg total) by mouth 3 (three) times daily as needed for migraine. May repeat in 2 hours if needed, Disp: 9 tablet, Rfl: 3   topiramate (TOPAMAX) 25 MG tablet, Take one tablet at night for one week, then take 2 tablets at night for one week, then take 3 tablets at night., Disp: 90 tablet, Rfl: 3   VICTOZA 18 MG/3ML SOPN, Inject into the skin., Disp: , Rfl:   Observations/Objective: Patient is well-developed, well-nourished in no acute distress.  Resting comfortably  at home.  Head is normocephalic, atraumatic.  No labored breathing.  Speech is clear and coherent with logical content.  Patient is alert and oriented at baseline.  Nasal congestion  Assessment and Plan: 1. History of COVID-19 - amoxicillin-clavulanate (AUGMENTIN) 875-125 MG tablet; Take 1 tablet by mouth 2 (two) times daily.  Dispense: 14 tablet; Refill: 0  2. Acute sinusitis, recurrence not specified, unspecified location - amoxicillin-clavulanate (AUGMENTIN) 875-125 MG tablet; Take 1 tablet by mouth 2 (two) times daily.  Dispense: 14 tablet; Refill: 0  - Take meds as prescribed - Use a cool mist humidifier  -Use saline nose sprays frequently -Force fluids -For any cough or congestion  Use plain Mucinex- regular strength or max strength is fine -For fever or aces or pains- take tylenol or ibuprofen. -Throat lozenges if help -Follow up if symptoms worsen or do not improve  Follow Up Instructions: I discussed the assessment and treatment plan with the patient. The patient was provided an opportunity to ask questions and all were answered.  The patient agreed with the plan and demonstrated an understanding of the instructions.  A copy of instructions were sent to the patient via MyChart unless otherwise noted below.     The patient was advised to call back or seek an in-person evaluation if the symptoms worsen or if the condition fails to improve as anticipated.  Time:  I spent 9 minutes with the patient via telehealth technology discussing the above problems/concerns.    Evelina Dun, FNP

## 2023-03-30 DIAGNOSIS — H209 Unspecified iridocyclitis: Principal | ICD-10-CM

## 2023-03-30 DIAGNOSIS — M47819 Spondylosis without myelopathy or radiculopathy, site unspecified: Principal | ICD-10-CM

## 2023-03-30 MED ORDER — METHOTREXATE SODIUM 2.5 MG TABLET
ORAL_TABLET | 1 refills | 0 days
Start: 2023-03-30 — End: ?

## 2023-03-31 MED ORDER — METHOTREXATE SODIUM 2.5 MG TABLET
ORAL_TABLET | 1 refills | 0 days | Status: CP
Start: 2023-03-31 — End: ?

## 2023-04-22 ENCOUNTER — Ambulatory Visit: Admit: 2023-04-22 | Discharge: 2023-04-22 | Payer: PRIVATE HEALTH INSURANCE

## 2023-04-22 DIAGNOSIS — Z171 Estrogen receptor negative status [ER-]: Principal | ICD-10-CM

## 2023-04-22 DIAGNOSIS — C50811 Malignant neoplasm of overlapping sites of right female breast: Principal | ICD-10-CM

## 2023-04-23 ENCOUNTER — Ambulatory Visit
Admission: EM | Admit: 2023-04-23 | Discharge: 2023-04-23 | Disposition: A | Discharge status: Home / Self Care | Payer: Managed Care, Other (non HMO) | Attending: Physician Assistant | Admitting: Physician Assistant

## 2023-04-23 DIAGNOSIS — J069 Acute upper respiratory infection, unspecified: Secondary | ICD-10-CM | POA: Insufficient documentation

## 2023-04-23 DIAGNOSIS — R051 Acute cough: Secondary | ICD-10-CM | POA: Diagnosis not present

## 2023-04-23 DIAGNOSIS — Z1152 Encounter for screening for COVID-19: Secondary | ICD-10-CM | POA: Diagnosis not present

## 2023-04-23 LAB — SARS CORONAVIRUS 2 BY RT PCR: SARS Coronavirus 2 by RT PCR: NEGATIVE

## 2023-04-23 MED ORDER — IPRATROPIUM BROMIDE 0.06 % NA SOLN: 2.0000 | Freq: Four times a day (QID) | NASAL | 0 refills | Status: AC

## 2023-04-23 MED ORDER — PROMETHAZINE-DM 6.25-15 MG/5ML PO SYRP: 5.0000 mL | ORAL_SOLUTION | Freq: Four times a day (QID) | ORAL | 0 refills | Status: AC | PRN

## 2023-04-23 NOTE — ED Triage Notes (Signed)
Pt c/o cough,nasal congestion & fatigue x2 days. Denies any fevers,has tried sinus medicine,tylenol & mucinex w/minor relief.

## 2023-04-23 NOTE — ED Provider Notes (Signed)
[facility]    CSN: [id number] Arrival date & time: 04/23/23  1208      History   Chief Complaint Chief Complaint  Patient presents with   Cough   Nasal Congestion   Fatigue         HPI Kristina Graham is a 57 y.o. female presenting for approximately 2-day history of fatigue, cough, congestion, mild sore throat.  Also reports ear pressure and discomfort.  Increased coughing when lying flat so she has been propping herself up on pillows at night.  No shortness of breath, vomiting or diarrhea.  Mild nausea.  Reports her sister and coworker are both sick.  One was diagnosed with a sinus infection and the other bronchitis.  Has been taking over-the-counter Mucinex and Tylenol without much relief.  No other complaints.  HPI  Past Medical History:  Diagnosis Date   Breast cancer (HCC)    stage 1   Dysplasia of cervix    HPV (human papilloma virus) infection    Iron deficiency anemia    Knee pain    Left   Leiomyoma of uterus    Menstrual migraine 03/04/2017   Migraine    Obesity    Uveitis    left    Patient Active Problem List   Diagnosis Date Noted   Anemia 07/23/2017   Nausea 04/14/2017   ASCUS of cervix with negative high risk HPV 03/14/2017   Hx of transient ischemic attack (TIA) 03/14/2017   Malignant neoplasm of lower-inner quadrant of right breast of female, estrogen receptor negative (HCC) 03/14/2017   Pre-diabetes 03/14/2017   Menstrual migraine 03/04/2017   Obesity 01/05/2015   Iron deficiency anemia 03/26/2013    Past Surgical History:  Procedure Laterality Date   FOOT SURGERY Bilateral 2013   HALLUX VALGUS CORRECTION      OB History   No obstetric history on file.      Home Medications    Prior to Admission medications   Medication Sig Start Date End Date Taking? Authorizing Provider  atorvastatin (LIPITOR) 40 MG tablet  11/16/19  Yes [provider]  clindamycin (CLEOCIN T) 1 % lotion Apply topically daily. 07/17/22  Yes  [provider]  empagliflozin (JARDIANCE) 25 MG TABS tablet Take by mouth. 04/06/21  Yes [provider]  ferrous gluconate (FERGON) 324 MG tablet Take 1 tablet (324 mg total) by mouth daily with breakfast. 03/04/17  Yes York Spaniel, MD  folic acid (FOLVITE) 1 MG tablet Take 1 mg by mouth daily. 05/14/22  Yes [provider]  GAVILYTE-G 236 g solution  10/23/18  Yes [provider]  ipratropium (ATROVENT) 0.06 % nasal spray Place 2 sprays into both nostrils 4 (four) times daily. 04/23/23  Yes Shirlee Latch, PA-C  metFORMIN (GLUCOPHAGE) 1000 MG tablet  08/28/21  Yes [provider]  methotrexate 50 MG/2ML injection Inject into the skin. 07/12/22  Yes [provider]  promethazine-dextromethorphan (PROMETHAZINE-DM) 6.25-15 MG/5ML syrup Take 5 mLs by mouth 4 (four) times daily as needed. 04/23/23  Yes Eusebio Friendly B, PA-C  rizatriptan (MAXALT-MLT) 10 MG disintegrating tablet Take 1 tablet (10 mg total) by mouth 3 (three) times daily as needed for migraine. May repeat in 2 hours if needed 07/09/18  Yes York Spaniel, MD  topiramate (TOPAMAX) 25 MG tablet Take one tablet at night for one week, then take 2 tablets at night for one week, then take 3 tablets at night. 07/09/18  Yes York Spaniel,  MD  VICTOZA 18 MG/3ML SOPN Inject into the skin. 07/06/22  Yes [provider]    Family History Family History  Problem Relation Age of Onset   Diabetes Mother    Heart disease Mother    Hypertension Mother    COPD Mother    Cancer Mother    Diabetes Sister    Diabetes Maternal Grandmother    Heart disease Maternal Grandmother    Hypertension Maternal Grandmother    Cancer Maternal Grandmother     Social History Social History   Tobacco Use   Smoking status: Never   Smokeless tobacco: Never  Vaping Use   Vaping Use: Never used  Substance Use Topics   Alcohol use: No   Drug use: No     Allergies   Metaclazepam and  Sumatriptan succinate   Review of Systems Review of Systems  Constitutional:  Positive for fatigue. Negative for chills, diaphoresis and fever.  HENT:  Positive for congestion, ear pain, rhinorrhea and sore throat. Negative for sinus pressure and sinus pain.   Respiratory:  Positive for cough. Negative for shortness of breath.   Cardiovascular:  Negative for chest pain.  Gastrointestinal:  Positive for nausea. Negative for abdominal pain, diarrhea and vomiting.  Musculoskeletal:  Negative for arthralgias and myalgias.  Skin:  Negative for rash.  Neurological:  Negative for weakness and headaches.  Hematological:  Negative for adenopathy.     Physical Exam Triage Vital Signs ED Triage Vitals  Enc Vitals Group     BP      Pulse      Resp      Temp      Temp src      SpO2      Weight      Height      Head Circumference      Peak Flow      Pain Score      Pain Loc      Pain Edu?      Excl. in GC?    No data found.  Updated Vital Signs BP 110/78 (BP Location: Left Arm)   Pulse 90   Temp 98.7 F (37.1 C) (Oral)   Resp 16   Ht 5\' 5"  (1.651 m)   Wt 243 lb (110.2 kg)   LMP 07/09/2018 (Approximate)   SpO2 98%   BMI 40.44 kg/m    Physical Exam Vitals and nursing note reviewed.  Constitutional:      General: She is not in acute distress.    Appearance: Normal appearance. She is not ill-appearing or toxic-appearing.  HENT:     Head: Normocephalic and atraumatic.     Right Ear: Tympanic membrane, ear canal and external ear normal.     Left Ear: Tympanic membrane, ear canal and external ear normal.     Nose: Congestion present.     Mouth/Throat:     Mouth: Mucous membranes are moist.     Pharynx: Oropharynx is clear. Posterior oropharyngeal erythema present.  Eyes:     General: No scleral icterus.       Right eye: No discharge.        Left eye: No discharge.     Conjunctiva/sclera: Conjunctivae normal.  Cardiovascular:     Rate and Rhythm: Normal rate and  regular rhythm.     Heart sounds: Normal heart sounds.  Pulmonary:     Effort: Pulmonary effort is normal. No respiratory distress.     Breath sounds: Normal breath sounds.  Musculoskeletal:     Cervical back: Neck supple.  Skin:    General: Skin is dry.  Neurological:     General: No focal deficit present.     Mental Status: She is alert. Mental status is at baseline.     Motor: No weakness.     Gait: Gait normal.  Psychiatric:        Mood and Affect: Mood normal.        Behavior: Behavior normal.        Thought Content: Thought content normal.      UC Treatments / Results  Labs (all labs ordered are listed, but only abnormal results are displayed) Labs Reviewed  SARS CORONAVIRUS 2 BY RT PCR    EKG   Radiology No results found.  Procedures Procedures (including critical care time)  Medications Ordered in UC Medications - No data to display  Initial Impression / Assessment and Plan / UC Course  I have reviewed the triage vital signs and the nursing notes.  Pertinent labs & imaging results that were available during my care of the patient were reviewed by me and considered in my medical decision making (see chart for details).   57 year old female presents for 2-day history of fatigue, cough, congestion, ear pain, nausea.  Vitals are normal and stable.  Patient overall well-appearing.  On exam has nasal congestion and erythema posterior pharynx.  Chest clear.  PCR COVID testing obtained.  Negative results. Reviewed with patient. Viral URI. Supportive care encouraged with increasing rest and fluids.  Sent Promethazine DM and Atrovent nasal spray. Reviewed return and ED precautions. Work note given.   Final Clinical Impressions(s) / UC Diagnoses   Final diagnoses:  Viral upper respiratory tract infection  Acute cough     Discharge Instructions      URI/COLD SYMPTOMS: Your exam today is consistent with a viral illness. Antibiotics are not indicated at  this time. Use medications as directed, including cough syrup, nasal saline, and decongestants. Your symptoms should improve over the next few days and resolve within 1-2 weeks. Increase rest and fluids. F/u if symptoms worsen or predominate such as sore throat, ear pain, productive cough, shortness of breath, or if you develop high fevers or worsening fatigue over the next several days.       ED Prescriptions     Medication Sig Dispense Auth. Provider   promethazine-dextromethorphan (PROMETHAZINE-DM) 6.25-15 MG/5ML syrup Take 5 mLs by mouth 4 (four) times daily as needed. 118 mL Eusebio Friendly B, PA-C   ipratropium (ATROVENT) 0.06 % nasal spray Place 2 sprays into both nostrils 4 (four) times daily. 15 mL Shirlee Latch, PA-C      PDMP not reviewed this encounter.   Shirlee Latch, PA-C 04/23/23 1315

## 2023-04-23 NOTE — Discharge Instructions (Signed)
URI/COLD SYMPTOMS: Your exam today is consistent with a viral illness. Antibiotics are not indicated at this time. Use medications as directed, including cough syrup, nasal saline, and decongestants. Your symptoms should improve over the next few days and resolve within 1-2 weeks. Increase rest and fluids. F/u if symptoms worsen or predominate such as sore throat, ear pain, productive cough, shortness of breath, or if you develop high fevers or worsening fatigue over the next several days.   

## 2023-05-16 ENCOUNTER — Ambulatory Visit: Admit: 2023-05-16 | Discharge: 2023-05-17 | Payer: PRIVATE HEALTH INSURANCE

## 2023-05-16 DIAGNOSIS — Z79899 Other long term (current) drug therapy: Principal | ICD-10-CM

## 2023-05-16 DIAGNOSIS — M47819 Spondylosis without myelopathy or radiculopathy, site unspecified: Principal | ICD-10-CM

## 2023-05-16 DIAGNOSIS — H209 Unspecified iridocyclitis: Principal | ICD-10-CM

## 2023-05-16 MED ORDER — FOLIC ACID 1 MG TABLET
ORAL_TABLET | Freq: Every day | ORAL | 3 refills | 90 days | Status: CP
Start: 2023-05-16 — End: ?

## 2023-05-16 MED ORDER — METHOTREXATE SODIUM 2.5 MG TABLET
ORAL_TABLET | ORAL | 3 refills | 84 days | Status: CP
Start: 2023-05-16 — End: ?

## 2023-05-19 ENCOUNTER — Ambulatory Visit: Admit: 2023-05-19 | Discharge: 2023-05-20 | Payer: PRIVATE HEALTH INSURANCE

## 2023-05-19 MED ORDER — OZEMPIC 1 MG/DOSE (4 MG/3 ML) SUBCUTANEOUS PEN INJECTOR
SUBCUTANEOUS | 0 refills | 84 days | Status: CP
Start: 2023-05-19 — End: ?

## 2023-05-19 MED ORDER — EMPAGLIFLOZIN 25 MG TABLET
ORAL_TABLET | Freq: Every day | ORAL | 3 refills | 90 days | Status: CP
Start: 2023-05-19 — End: ?

## 2023-06-17 ENCOUNTER — Ambulatory Visit: Admit: 2023-06-17 | Discharge: 2023-06-18 | Payer: PRIVATE HEALTH INSURANCE

## 2023-06-17 DIAGNOSIS — N182 Chronic kidney disease, stage 2 (mild): Principal | ICD-10-CM

## 2023-06-17 DIAGNOSIS — Z124 Encounter for screening for malignant neoplasm of cervix: Principal | ICD-10-CM

## 2023-06-17 DIAGNOSIS — E1122 Type 2 diabetes mellitus with diabetic chronic kidney disease: Principal | ICD-10-CM

## 2023-06-30 DIAGNOSIS — H209 Unspecified iridocyclitis: Principal | ICD-10-CM

## 2023-06-30 DIAGNOSIS — M47819 Spondylosis without myelopathy or radiculopathy, site unspecified: Principal | ICD-10-CM

## 2023-06-30 MED ORDER — METHOTREXATE SODIUM 2.5 MG TABLET
ORAL_TABLET | ORAL | 3 refills | 84 days
Start: 2023-06-30 — End: ?

## 2023-07-01 DIAGNOSIS — H209 Unspecified iridocyclitis: Principal | ICD-10-CM

## 2023-07-01 DIAGNOSIS — M47819 Spondylosis without myelopathy or radiculopathy, site unspecified: Principal | ICD-10-CM

## 2023-07-01 MED ORDER — METHOTREXATE SODIUM 2.5 MG TABLET
ORAL_TABLET | ORAL | 3 refills | 84 days | Status: CP
Start: 2023-07-01 — End: ?

## 2023-07-02 MED ORDER — METHOTREXATE SODIUM 2.5 MG TABLET
ORAL_TABLET | ORAL | 3 refills | 84 days
Start: 2023-07-02 — End: ?

## 2023-09-03 DIAGNOSIS — E1122 Type 2 diabetes mellitus with diabetic chronic kidney disease: Principal | ICD-10-CM

## 2023-09-03 DIAGNOSIS — N182 Chronic kidney disease, stage 2 (mild): Principal | ICD-10-CM

## 2023-09-04 ENCOUNTER — Ambulatory Visit: Admit: 2023-09-04 | Discharge: 2023-09-05 | Payer: PRIVATE HEALTH INSURANCE

## 2023-10-10 ENCOUNTER — Ambulatory Visit: Admit: 2023-10-10 | Discharge: 2023-10-11 | Payer: PRIVATE HEALTH INSURANCE

## 2023-10-29 ENCOUNTER — Ambulatory Visit: Payer: Managed Care, Other (non HMO)

## 2023-10-29 ENCOUNTER — Ambulatory Visit
Admission: EM | Admit: 2023-10-29 | Discharge: 2023-10-29 | Disposition: A | Discharge status: Home / Self Care | Payer: Managed Care, Other (non HMO)

## 2023-10-29 DIAGNOSIS — M5010 Cervical disc disorder with radiculopathy, unspecified cervical region: Secondary | ICD-10-CM

## 2023-10-29 DIAGNOSIS — M436 Torticollis: Secondary | ICD-10-CM

## 2023-10-29 MED ORDER — DIAZEPAM 5 MG PO TABS: 5.0000 mg | ORAL_TABLET | Freq: Every evening | ORAL | 0 refills | Status: AC

## 2023-10-29 MED ORDER — BACLOFEN 10 MG PO TABS: 10.0000 mg | ORAL_TABLET | Freq: Three times a day (TID) | ORAL | 0 refills | Status: AC

## 2023-10-29 MED ORDER — PREDNISONE 10 MG (21) PO TBPK: ORAL_TABLET | ORAL | 0 refills | Status: AC

## 2023-10-29 NOTE — ED Provider Notes (Signed)
[facility]    CSN: [id number] Arrival date & time: 10/29/23  0818      History   Chief Complaint Chief Complaint  Patient presents with   Torticollis    HPI Kristina Graham is a 57 y.o. female.   HPI  57 year old female with a past medical history significant for migraines, prediabetes, IDA, stage I breast cancer, and TIA presents for evaluation of neck stiffness that began Sunday morning.  She reports that she woke up 3 days ago and has significant stiffness in her neck, predominantly on the right-hand side.  Monday she went to the chiropractor and the chiropractor was unable to adjust her in the morning so had her return in the afternoon and she was able to finally do an adjustment.  The patient reports that she feels as if her symptoms intensified following the adjustment.  This morning she woke up and she had numbness in her right arm that lasted approximately 15 minutes.  That has resolved.  She reports that the chiropractor mentioned that her right grip was weaker.  Past Medical History:  Diagnosis Date   Breast cancer (HCC)    stage 1   Dysplasia of cervix    HPV (human papilloma virus) infection    Iron deficiency anemia    Knee pain    Left   Leiomyoma of uterus    Menstrual migraine 03/04/2017   Migraine    Obesity    Uveitis    left    Patient Active Problem List   Diagnosis Date Noted   Anemia 07/23/2017   Nausea 04/14/2017   ASCUS of cervix with negative high risk HPV 03/14/2017   Hx of transient ischemic attack (TIA) 03/14/2017   Malignant neoplasm of lower-inner quadrant of right breast of female, estrogen receptor negative (HCC) 03/14/2017   Pre-diabetes 03/14/2017   Menstrual migraine 03/04/2017   Obesity 01/05/2015   Iron deficiency anemia 03/26/2013    Past Surgical History:  Procedure Laterality Date   FOOT SURGERY Bilateral 2013   HALLUX VALGUS CORRECTION      OB History   No obstetric history on file.      Home  Medications    Prior to Admission medications   Medication Sig Start Date End Date Taking? Authorizing Provider  baclofen (LIORESAL) 10 MG tablet Take 1 tablet (10 mg total) by mouth 3 (three) times daily. 10/29/23  Yes Becky Augusta, NP  diazepam (VALIUM) 5 MG tablet Take 1 tablet (5 mg total) by mouth at bedtime for 5 days. 10/29/23 11/03/23 Yes Becky Augusta, NP  empagliflozin (JARDIANCE) 25 MG TABS tablet Take by mouth. 04/06/21  Yes [provider]  methotrexate 50 MG/2ML injection Inject into the skin. 07/12/22  Yes [provider]  OZEMPIC, 1 MG/DOSE, 4 MG/3ML SOPN Inject 1 mg into the skin once a week. 07/09/23  Yes [provider]  predniSONE (STERAPRED UNI-PAK 21 TAB) 10 MG (21) TBPK tablet Take 6 tablets on day 1, 5 tablets day 2, 4 tablets day 3, 3 tablets day 4, 2 tablets day 5, 1 tablet day 6 10/29/23  Yes Becky Augusta, NP  atorvastatin (LIPITOR) 40 MG tablet  11/16/19   [provider]  clindamycin (CLEOCIN T) 1 % lotion Apply topically daily. 07/17/22   [provider]  ferrous gluconate (FERGON) 324 MG tablet Take 1 tablet (324 mg total) by mouth daily with breakfast. 03/04/17   York Spaniel, MD  folic acid (FOLVITE) 1 MG tablet Take  1 mg by mouth daily. 05/14/22   [provider]  GAVILYTE-G 236 g solution  10/23/18   [provider]  ipratropium (ATROVENT) 0.06 % nasal spray Place 2 sprays into both nostrils 4 (four) times daily. 04/23/23   Shirlee Latch, PA-C  metFORMIN (GLUCOPHAGE) 1000 MG tablet  08/28/21   [provider]  promethazine-dextromethorphan (PROMETHAZINE-DM) 6.25-15 MG/5ML syrup Take 5 mLs by mouth 4 (four) times daily as needed. 04/23/23   Eusebio Friendly B, PA-C  rizatriptan (MAXALT-MLT) 10 MG disintegrating tablet Take 1 tablet (10 mg total) by mouth 3 (three) times daily as needed for migraine. May repeat in 2 hours if needed 07/09/18   York Spaniel, MD  topiramate (TOPAMAX) 25 MG tablet Take  one tablet at night for one week, then take 2 tablets at night for one week, then take 3 tablets at night. 07/09/18   York Spaniel, MD  VICTOZA 18 MG/3ML SOPN Inject into the skin. 07/06/22   [provider]    Family History Family History  Problem Relation Age of Onset   Diabetes Mother    Heart disease Mother    Hypertension Mother    COPD Mother    Cancer Mother    Diabetes Sister    Diabetes Maternal Grandmother    Heart disease Maternal Grandmother    Hypertension Maternal Grandmother    Cancer Maternal Grandmother     Social History Social History   Tobacco Use   Smoking status: Never   Smokeless tobacco: Never  Vaping Use   Vaping status: Never Used  Substance Use Topics   Alcohol use: No   Drug use: No     Allergies   Metaclazepam and Sumatriptan succinate   Review of Systems Review of Systems  Constitutional:  Negative for fever.  Musculoskeletal:  Positive for neck pain and neck stiffness.  Skin:  Negative for rash.  Neurological:  Positive for weakness and numbness. Negative for headaches.     Physical Exam Triage Vital Signs ED Triage Vitals  Encounter Vitals Group     BP 10/29/23 0830 106/72     Systolic BP Percentile --      Diastolic BP Percentile --      Pulse Rate 10/29/23 0830 88     Resp 10/29/23 0830 19     Temp 10/29/23 0830 99 F (37.2 C)     Temp Source 10/29/23 0830 Oral     SpO2 10/29/23 0830 100 %     Weight --      Height --      Head Circumference --      Peak Flow --      Pain Score 10/29/23 0829 9     Pain Loc --      Pain Education --      Exclude from Growth Chart --    No data found.  Updated Vital Signs BP 106/72 (BP Location: Left Arm)   Pulse 88   Temp 99 F (37.2 C) (Oral)   Resp 19   LMP 07/09/2018 (Approximate)   SpO2 100%   Visual Acuity Right Eye Distance:   Left Eye Distance:   Bilateral Distance:    Right Eye Near:   Left Eye Near:    Bilateral Near:     Physical Exam Vitals  and nursing note reviewed.  Constitutional:      Appearance: Normal appearance. She is not ill-appearing.  Musculoskeletal:        General: Tenderness  present. No swelling, deformity or signs of injury.  Skin:    General: Skin is warm and dry.     Capillary Refill: Capillary refill takes less than 2 seconds.     Findings: No rash.  Neurological:     General: No focal deficit present.     Mental Status: She is alert and oriented to person, place, and time.     Cranial Nerves: No cranial nerve deficit.     Sensory: No sensory deficit.     Motor: No weakness.      UC Treatments / Results  Labs (all labs ordered are listed, but only abnormal results are displayed) Labs Reviewed - No data to display  EKG   Radiology No results found.  Procedures Procedures (including critical care time)  Medications Ordered in UC Medications - No data to display  Initial Impression / Assessment and Plan / UC Course  I have reviewed the triage vital signs and the nursing notes.  Pertinent labs & imaging results that were available during my care of the patient were reviewed by me and considered in my medical decision making (see chart for details).   Patient is a pleasant, nontoxic-appearing 57 year old female presenting for evaluation of neck pain and stiffness x 3 days as outlined HPI above.  In the exam room the patient is holding her head in a rigid position and will look side-to-side with her eyes but will not turn her head secondary to pain and stiffness in her neck.  She does not have any midline spinous process tenderness or step-off in her cervical upper thoracic region though she does have significant tenderness and spasm in the trapezius muscles bilaterally.  She is moving all extremities equally and she has 5/5 upper extremity strength and grips bilaterally.  She did experience numbness this morning when she woke up in her right arm but is unclear if this is coming from her neck or if  she had laid on her arm wrong which caused a nerve impingement.  She does not have any numbness at present.  I will obtain radiographs of her neck to evaluate for cervical radiculopathy.  Cervical spine films independently reviewed and evaluated by me.  Impression: There is a loss of cervical lordosis as well as degenerative changes noted throughout the cervical spine most prominent being between C4 and C5 and C5-C6.  No evidence of fracture or dislocation.  Radiology overread is pending.  I will discharge patient home with a diagnosis of torticollis and cervical radiculopathy.  I will start her on a 6-day taper of prednisone along with baclofen 3 times a day to help with spasm.  She should continue alternating ice and moist heat.  I will also give her home physical therapy exercises to perform.  Additionally, I will give her 5 mg dose of Valium that she can use at bedtime to help with neck spasm for the next 3 nights.  Review of PDMP does not reveal any open narcotic scripts.   Final Clinical Impressions(s) / UC Diagnoses   Final diagnoses:  Torticollis, acute  Cervical disc disorder with radiculopathy of cervical region     Discharge Instructions      Your x-rays of your neck show that you have definitive spasm as well as degenerative changes.  These could be contributing to the neck stiffness as well as the numbness you are experiencing in your right arm.  Take the prednisone according to the package instructions each morning for the next 6  days to help decrease pain and inflammation in your neck.  Take the baclofen 10 mg every 8 hours as needed for neck spasm.  I am going to prescribe you diazepam that I want you to take at bedtime.  This will help you sleep but it is also a powerful muscle relaxer.  You will only take it for the next 3 days.  Follow the home physical therapy exercises provided in your discharge instructions.  If your symptoms continue I recommend that you follow-up  with the spine clinic at [facility].  I have given you the contact information for Dr. Myer Haff who is a neurosurgeon with [facility] at [facility].     ED Prescriptions     Medication Sig Dispense Auth. Provider   predniSONE (STERAPRED UNI-PAK 21 TAB) 10 MG (21) TBPK tablet Take 6 tablets on day 1, 5 tablets day 2, 4 tablets day 3, 3 tablets day 4, 2 tablets day 5, 1 tablet day 6 21 tablet Becky Augusta, NP   baclofen (LIORESAL) 10 MG tablet Take 1 tablet (10 mg total) by mouth 3 (three) times daily. 30 each Becky Augusta, NP   diazepam (VALIUM) 5 MG tablet Take 1 tablet (5 mg total) by mouth at bedtime for 5 days. 3 tablet Becky Augusta, NP      I have reviewed the PDMP during this encounter.   Becky Augusta, NP 10/29/23 [phone]

## 2023-10-29 NOTE — Discharge Instructions (Addendum)
Your x-rays of your neck show that you have definitive spasm as well as degenerative changes.  These could be contributing to the neck stiffness as well as the numbness you are experiencing in your right arm.  Take the prednisone according to the package instructions each morning for the next 6 days to help decrease pain and inflammation in your neck.  Take the baclofen 10 mg every 8 hours as needed for neck spasm.  I am going to prescribe you diazepam that I want you to take at bedtime.  This will help you sleep but it is also a powerful muscle relaxer.  You will only take it for the next 3 days.  Follow the home physical therapy exercises provided in your discharge instructions.  If your symptoms continue I recommend that you follow-up with the spine clinic at [facility].  I have given you the contact information for Dr. Myer Haff who is a neurosurgeon with [facility] at [facility].

## 2023-10-29 NOTE — ED Triage Notes (Addendum)
Patient states that she woke up Sunday morning with a stiff neck. Went to the chiropractor twice on Monday it didn't help. Patient states that her right arm when numb for about 15 min last night and fingers were tingling. Patient has been taking IBU for the pain.

## 2023-11-18 ENCOUNTER — Ambulatory Visit: Admit: 2023-11-18 | Discharge: 2023-11-19 | Payer: PRIVATE HEALTH INSURANCE

## 2023-11-18 DIAGNOSIS — M47819 Spondylosis without myelopathy or radiculopathy, site unspecified: Principal | ICD-10-CM

## 2023-11-18 DIAGNOSIS — Z79631 Methotrexate, long term, current use: Principal | ICD-10-CM

## 2023-11-18 DIAGNOSIS — M62838 Other muscle spasm: Principal | ICD-10-CM

## 2023-11-18 MED ORDER — CYCLOBENZAPRINE 5 MG TABLET
ORAL_TABLET | Freq: Every evening | ORAL | 0 refills | 20.00 days | Status: CP | PRN
Start: 2023-11-18 — End: ?

## 2023-12-26 ENCOUNTER — Ambulatory Visit: Admit: 2023-12-26 | Discharge: 2023-12-27 | Payer: PRIVATE HEALTH INSURANCE

## 2023-12-31 DIAGNOSIS — Z171 Estrogen receptor negative status [ER-]: Principal | ICD-10-CM

## 2023-12-31 DIAGNOSIS — C50311 Malignant neoplasm of lower-inner quadrant of right female breast: Principal | ICD-10-CM

## 2024-01-02 MED ORDER — OZEMPIC 2 MG/DOSE (8 MG/3 ML) SUBCUTANEOUS PEN INJECTOR
SUBCUTANEOUS | 3 refills | 84 days | Status: CP
Start: 2024-01-02 — End: ?

## 2024-03-11 DIAGNOSIS — C50311 Malignant neoplasm of lower-inner quadrant of right female breast: Principal | ICD-10-CM

## 2024-03-11 DIAGNOSIS — Z171 Estrogen receptor negative status [ER-]: Principal | ICD-10-CM

## 2024-04-27 ENCOUNTER — Inpatient Hospital Stay: Admit: 2024-04-27 | Discharge: 2024-04-27 | Payer: PRIVATE HEALTH INSURANCE

## 2024-04-27 ENCOUNTER — Ambulatory Visit
Admit: 2024-04-27 | Discharge: 2024-04-27 | Payer: PRIVATE HEALTH INSURANCE | Attending: Nurse Practitioner | Primary: Nurse Practitioner

## 2024-04-27 DIAGNOSIS — C50311 Malignant neoplasm of lower-inner quadrant of right female breast: Principal | ICD-10-CM

## 2024-04-27 DIAGNOSIS — C50811 Malignant neoplasm of overlapping sites of right female breast: Principal | ICD-10-CM

## 2024-04-27 DIAGNOSIS — Z1231 Encounter for screening mammogram for malignant neoplasm of breast: Principal | ICD-10-CM

## 2024-04-27 DIAGNOSIS — Z171 Estrogen receptor negative status [ER-]: Principal | ICD-10-CM

## 2024-04-30 ENCOUNTER — Ambulatory Visit: Admit: 2024-04-30 | Discharge: 2024-05-01 | Payer: BLUE CROSS/BLUE SHIELD

## 2024-04-30 DIAGNOSIS — E1122 Type 2 diabetes mellitus with diabetic chronic kidney disease: Principal | ICD-10-CM

## 2024-04-30 DIAGNOSIS — N182 Chronic kidney disease, stage 2 (mild): Principal | ICD-10-CM

## 2024-04-30 MED ORDER — ATORVASTATIN 40 MG TABLET
ORAL_TABLET | Freq: Every day | ORAL | 3 refills | 90.00000 days | Status: CP
Start: 2024-04-30 — End: ?

## 2024-04-30 MED ORDER — EMPAGLIFLOZIN 25 MG TABLET
ORAL_TABLET | Freq: Every day | ORAL | 3 refills | 90.00000 days | Status: CP
Start: 2024-04-30 — End: ?

## 2024-04-30 MED ORDER — METFORMIN 1,000 MG TABLET
ORAL_TABLET | Freq: Two times a day (BID) | ORAL | 3 refills | 90.00000 days | Status: CP
Start: 2024-04-30 — End: ?

## 2024-05-21 ENCOUNTER — Ambulatory Visit: Admit: 2024-05-21 | Discharge: 2024-05-21 | Payer: PRIVATE HEALTH INSURANCE

## 2024-05-21 DIAGNOSIS — M47819 Spondylosis without myelopathy or radiculopathy, site unspecified: Principal | ICD-10-CM

## 2024-05-21 DIAGNOSIS — H209 Unspecified iridocyclitis: Principal | ICD-10-CM

## 2024-05-21 DIAGNOSIS — Z79899 Other long term (current) drug therapy: Principal | ICD-10-CM

## 2024-05-21 MED ORDER — METHOTREXATE SODIUM 2.5 MG TABLET
ORAL_TABLET | ORAL | 3 refills | 84.00000 days | Status: CP
Start: 2024-05-21 — End: ?

## 2024-05-21 MED ORDER — FOLIC ACID 1 MG TABLET
ORAL_TABLET | Freq: Every day | ORAL | 3 refills | 90.00000 days | Status: CP
Start: 2024-05-21 — End: ?

## 2024-05-21 MED ORDER — HUMIRA PEN CITRATE FREE 40 MG/0.4 ML
SUBCUTANEOUS | 3 refills | 84.00000 days | Status: CP
Start: 2024-05-21 — End: ?

## 2024-05-22 DIAGNOSIS — E1122 Type 2 diabetes mellitus with diabetic chronic kidney disease: Principal | ICD-10-CM

## 2024-05-22 DIAGNOSIS — N182 Chronic kidney disease, stage 2 (mild): Principal | ICD-10-CM

## 2024-05-24 DIAGNOSIS — M47819 Spondylosis without myelopathy or radiculopathy, site unspecified: Principal | ICD-10-CM

## 2024-05-24 DIAGNOSIS — H209 Unspecified iridocyclitis: Principal | ICD-10-CM

## 2024-06-04 DIAGNOSIS — M47819 Spondylosis without myelopathy or radiculopathy, site unspecified: Principal | ICD-10-CM

## 2024-06-04 DIAGNOSIS — H209 Unspecified iridocyclitis: Principal | ICD-10-CM

## 2024-06-04 MED ORDER — ADALIMUMAB-ADBM 40 MG/0.4 ML SUBCUTANEOUS PEN KIT
PACK | SUBCUTANEOUS | 3 refills | 0.00000 days | Status: CP
Start: 2024-06-04 — End: ?

## 2024-06-08 DIAGNOSIS — H209 Unspecified iridocyclitis: Principal | ICD-10-CM

## 2024-06-08 DIAGNOSIS — M47819 Spondylosis without myelopathy or radiculopathy, site unspecified: Principal | ICD-10-CM

## 2024-06-14 NOTE — Unmapped (Signed)
 [facility] Specialty Medication Onboarding    Specialty Medication: adalimumab -adbm 40 mg/0.4 mL Pnkt (CYLTEZO (CF) PEN)  Prior Authorization: Approved   Financial Assistance: No - copay card is available but patient is required to apply  Final Copay/Day Supply: $250 / 28    Insurance Restrictions: Yes - max 1 month supply     Notes to Pharmacist: Per referral - MAP Team Communication Attempts to Patient:  Unable to reach patient  via phone. There were no patient identifies on her voicemail so I left a message advising I'd send her a mychart message with the information to enroll in copay assistance. As of 06/08/2024 Copay card program will no longer work for brand Cyltezo  medications. Pharmacy will need to fill for generic medication for copay card to work.  Credit Card on File: no  Start Date on Rx:  06/04/24    The triage team has completed the benefits investigation and has determined that the patient is able to fill this medication at [facility]. Please contact the patient to complete the onboarding or follow up with the prescribing physician as needed.

## 2024-06-28 NOTE — Unmapped (Signed)
 The [facility] has made a third and final attempt to reach this patient to refill the following medication:CYLTEZO .      We have left voicemails on the following phone numbers: [phone].    Dates contacted: 7/7, 7/14, 7/18  Last scheduled delivery: Waiting for patient to call back with copay card before onboarding. Copay is $250 without copay card    The patient may be at risk of non-compliance with this medication. The patient should call the [facility] at [phone]  Option 4, then Option 2: Dermatology, Gastroenterology, Rheumatology to refill medication.    [name], PharmD   [facility] Specialty Pharmacist

## 2024-07-09 DIAGNOSIS — H209 Unspecified iridocyclitis: Principal | ICD-10-CM

## 2024-07-09 DIAGNOSIS — M47819 Spondylosis without myelopathy or radiculopathy, site unspecified: Principal | ICD-10-CM

## 2024-07-12 NOTE — Unmapped (Signed)
 [facility] - Enhanced Care Program  Reason for Call: Medication Access; Type: Onboarding- needs copay card  Referral Request: Rheumatology - Latanya Finner, CPP    Summary of Telephone Encounter  Received referral from Thuy Phan, CPP to refill patient's Cyltezo  and have patient set up a copay card. [facility] called patient at [phone] and left a voicemail. Patient has not been able to fill this medication due to copay of $250.  Called patient at [phone] and spoke with the patient. Informed her about the copay card [employer] at [phone]  [phone]) and instructed her to call them to set the card up. Provided her the information she would need about the prescription (Cyltezo  40 mg/0.4 mL subcutaneous injection every 14 days needed for diagnosis Uveitis and Spondyloarthritis.  Instructed the patient to call Debbie back once she has the copay card set up and she will transfer the patient to [facility] for onboarding and providing the copay card information.    Follow-Up:  Follow up with patient about copay card set up.     Call Attempt #: 1  Time Spent on Referral: 10 minutes  Number of Days Spent on Referral: 1    Penny Gibbs, PharmD Candidate  Ambulatory Care Intern

## 2024-07-15 NOTE — Unmapped (Signed)
 [facility] - Enhanced Care Program  Reason for Call: Medication Access; Type: Onboarding- needs copay card  Referral Request: Rheumatology - Latanya Finner, CPP    Summary of Telephone Encounter  Received referral from Thuy Phan, CPP to refill patient's Cyltezo  and have patient set up a copay card. [facility] called patient at [phone] and left a voicemail. Patient has not been able to fill this medication due to copay of $250.  Called patient at [phone]  to follow up to see if she had a chance to call to obtain copay card for Cyltezo . On previous call I provided patient with contact information for [employer] [phone]  [phone]).  I left patient a voicemail to return call and provided contact information.    Follow-Up:  Continue calling patient to follow up about copay card.    Call Attempt #: 2  Time Spent on Referral: 15 minutes  Number of Days Spent on Referral: 2    Penny Gibbs, PharmD Candidate  Ambulatory Care Intern

## 2024-07-19 NOTE — Unmapped (Signed)
 [facility] - Enhanced Care Program  Reason for Call: Medication Access; Type: Onboarding- needs copay card  Referral Request: Rheumatology - Latanya Finner, CPP    Summary of Telephone Encounter  Received referral from Thuy Phan, CPP to refill patient's Cyltezo  and have patient set up a copay card. [facility] called patient at [phone] and left a voicemail. Patient has not been able to fill this medication due to copay of $250.  Called patient at [phone]  to follow up to see if she had a chance to call to obtain copay card for Cyltezo . On initial call I provided patient with contact information for [employer] [phone]  [phone]).  I left patient another voicemail to return call and provided contact information.    Follow-Up:  Continue calling patient to follow up about copay card.    Call Attempt #: 3  Time Spent on Referral: 15 minutes  Number of Days Spent on Referral: 3    Johnsie Dove, PharmD Candidate  Ambulatory Care Intern

## 2024-07-19 NOTE — Unmapped (Signed)
  Pharmacy - Enhanced Care Program  Reason for Call: Medication Access; Type: Onboarding- needs copay card  Referral Request: Rheumatology - Latanya Finner, CPP    Summary of Telephone Encounter  I received a call back from pt regarding copay card information for Cyltezo . Pt stated she filled out the copay card information but did not receive any information after that.   I provided a conference call with pt over to [employer]. We spoke with representative Danielle. She said it shows that pt applied for their program and was approved on 06/10/2024 and should of received an e-mail with her debit card information. They also confirmed they had the correct e-mail on file.   I hung up so the patient could get all of her debit card information verbally and then she will give me a call back.   Follow-Up:  Once I have the information I will connect pt to [facility] so she can receive onboarding for Cyltezo .     Call Attempt #: 3  Time Spent on Referral: 25 minutes  Number of Days Spent on Referral: 3    Marval Hussar RN, [facility]   Pharmacy Department  [facility]  [address]   [place], [place] [address]  [phone]

## 2024-07-22 NOTE — Unmapped (Signed)
 [facility] - Enhanced Care Program  Reason for Call: Medication Access; Type: Onboarding- needs copay card  Referral Request: Rheumatology - Latanya Finner, CPP    Summary of Telephone Encounter  We provided a conference call with pt over to [facility]. They informed her she was approved and provided the patient her debit card information over the phone. The patient did not want Debbie to stay on the line for this information and did not call her back afterwards.   Debbie attempted to call patient at [phone] to conference call the patient to [facility] for coordination of refill.  Left the patient a voicemail requesting that she calls [facility] pharmacy (phone #: [phone]) Option 4, Option 2 and provides them with the copay card information and coordinates  refill of her medication.    Follow-Up:  We will close out the referral and notify Latanya Finner, CPP  If patient returns our call we will be happy to connect her with [facility].      Call Attempt #: 4  Time Spent on Referral: 25 minutes  Number of Days Spent on Referral: 4    Johnsie Dove, PharmD Candidate  Ambulatory Care Intern

## 2024-09-03 DIAGNOSIS — N182 Chronic kidney disease, stage 2 (mild): Principal | ICD-10-CM

## 2024-09-03 DIAGNOSIS — E1122 Type 2 diabetes mellitus with diabetic chronic kidney disease: Principal | ICD-10-CM

## 2024-09-03 MED ORDER — OZEMPIC 2 MG/DOSE (8 MG/3 ML) SUBCUTANEOUS PEN INJECTOR
SUBCUTANEOUS | 3 refills | 70.00000 days | Status: CP
Start: 2024-09-03 — End: ?

## 2024-09-10 NOTE — Unmapped (Signed)
 Specialty Medication(s): CYLTEZO     Penny Gibbs has been dis-enrolled from the [facility] specialty pharmacy services as a result of multiple unsuccessful outreach attempts by the pharmacy.    Additional information provided to the patient: unable to reach patient to onboard for CYLTEZO     [name], PharmD  [facility] Specialty Pharmacist

## 2024-09-21 ENCOUNTER — Ambulatory Visit: Admit: 2024-09-21 | Discharge: 2024-09-22 | Payer: PRIVATE HEALTH INSURANCE

## 2024-09-21 ENCOUNTER — Encounter: Admit: 2024-09-21 | Discharge: 2024-09-22 | Payer: PRIVATE HEALTH INSURANCE

## 2024-09-21 DIAGNOSIS — N182 Chronic kidney disease, stage 2 (mild): Principal | ICD-10-CM

## 2024-09-21 DIAGNOSIS — M47819 Spondylosis without myelopathy or radiculopathy, site unspecified: Principal | ICD-10-CM

## 2024-09-21 DIAGNOSIS — Z23 Encounter for immunization: Principal | ICD-10-CM

## 2024-09-21 DIAGNOSIS — Z79899 Other long term (current) drug therapy: Principal | ICD-10-CM

## 2024-09-21 DIAGNOSIS — E1122 Type 2 diabetes mellitus with diabetic chronic kidney disease: Principal | ICD-10-CM

## 2024-09-21 DIAGNOSIS — L0291 Cutaneous abscess, unspecified: Principal | ICD-10-CM

## 2024-09-21 DIAGNOSIS — H209 Unspecified iridocyclitis: Principal | ICD-10-CM

## 2024-09-21 MED ORDER — OZEMPIC 2 MG/DOSE (8 MG/3 ML) SUBCUTANEOUS PEN INJECTOR
SUBCUTANEOUS | 3 refills | 84.00000 days | Status: CP
Start: 2024-09-21 — End: ?

## 2024-09-28 MED ORDER — DOXYCYCLINE HYCLATE 100 MG TABLET
ORAL_TABLET | Freq: Two times a day (BID) | ORAL | 0 refills | 5.00000 days | Status: CP
Start: 2024-09-28 — End: 2024-10-03
